# Patient Record
Sex: Male | Born: 1945
Health system: Southern US, Community
[De-identification: ages and names within clinical notes are randomized; demographics above are authoritative.]

## PROBLEM LIST (undated history)

## (undated) DIAGNOSIS — I10 Essential (primary) hypertension: Secondary | ICD-10-CM

## (undated) DIAGNOSIS — R413 Other amnesia: Secondary | ICD-10-CM

## (undated) DIAGNOSIS — Z800 Family history of malignant neoplasm of digestive organs: Secondary | ICD-10-CM

## (undated) HISTORY — DX: Other amnesia: R41.3

## (undated) HISTORY — DX: Family history of malignant neoplasm of digestive organs: Z80.0

---

## 2006-07-17 ENCOUNTER — Ambulatory Visit: Payer: Self-pay | Admitting: Internal Medicine

## 2006-07-17 ENCOUNTER — Ambulatory Visit (HOSPITAL_COMMUNITY): Admission: RE | Admit: 2006-07-17 | Discharge: 2006-07-17 | Payer: Self-pay | Admitting: Internal Medicine

## 2007-08-05 ENCOUNTER — Ambulatory Visit (HOSPITAL_COMMUNITY): Admission: RE | Admit: 2007-08-05 | Discharge: 2007-08-05 | Payer: Self-pay | Admitting: Family Medicine

## 2009-01-12 ENCOUNTER — Emergency Department (HOSPITAL_COMMUNITY): Admission: EM | Admit: 2009-01-12 | Discharge: 2009-01-12 | Payer: Self-pay | Admitting: Emergency Medicine

## 2010-09-09 NOTE — Op Note (Signed)
NAME:  Russell Reed, Russell Reed              ACCOUNT NO.:  [id number]   MEDICAL RECORD NO.:  [id number]          PATIENT TYPE:  AMB   LOCATION:  DAY                           FACILITY:  [facility]   PHYSICIAN:  Lionel December, M.D.    DATE OF BIRTH:  1945/10/28   DATE OF PROCEDURE:  07/17/2006  DATE OF DISCHARGE:                               OPERATIVE REPORT   PROCEDURE:  Colonoscopy.   INDICATIONS:  [name] is 65 year old Afro American male who is undergoing  average risk screening colonoscopy.  Procedure and risks were reviewed  with the patient, informed consent was obtained.   MEDICATIONS FOR CONSCIOUS SEDATION:  Demerol 50 mg IV, Versed 5 mg IV in  divided doses.   FINDINGS:  Procedure performed in endoscopy suite.  The patient's vital  signs and O2 sat were monitored during procedure and remained stable.  The patient was placed in the left lateral position and rectal  examination performed.  No abnormality noted on external or digital  exam.  Pentax videoscope was placed in the rectum and advanced under  vision into the sigmoid colon and beyond.  Somewhat redundant colon with  excellent prep.  Using abdominal pressure and the patient on his back to  advance the scope into cecum which was identified by appendiceal orifice  and ileocecal valve.  Pictures taken for the record.  As the scope was  withdrawn, colonic mucosa was carefully examined and was normal  throughout.  Rectal mucosa similarly was normal.  Scope was retroflexed  to examine anorectal junction and small hemorrhoids noted below the  dentate line.  Endoscope was straightened and withdrawn.  The patient  tolerated the procedure well.   FINAL DIAGNOSIS:  Small external hemorrhoids, otherwise normal  colonoscopy.   RECOMMENDATIONS:  1. He will resume his usual medicines and  diet.  2. Yearly Hemoccults.  3. Consider next screening exam in 10 years from now.      Lionel December, M.D.  Electronically Signed     NR/MEDQ  D:   07/17/2006  T:  07/17/2006  Job:  [id number]   cc:   Annia Friendly. Loleta Chance, MD  Fax: [phone]

## 2014-07-06 ENCOUNTER — Emergency Department (HOSPITAL_COMMUNITY): Payer: No Typology Code available for payment source

## 2014-07-06 ENCOUNTER — Emergency Department (HOSPITAL_COMMUNITY)
Admission: EM | Admit: 2014-07-06 | Discharge: 2014-07-06 | Disposition: A | Discharge status: Home / Self Care | Payer: No Typology Code available for payment source | Attending: Emergency Medicine | Admitting: Emergency Medicine

## 2014-07-06 ENCOUNTER — Encounter (HOSPITAL_COMMUNITY): Payer: Self-pay

## 2014-07-06 DIAGNOSIS — Y92410 Unspecified street and highway as the place of occurrence of the external cause: Secondary | ICD-10-CM | POA: Insufficient documentation

## 2014-07-06 DIAGNOSIS — I10 Essential (primary) hypertension: Secondary | ICD-10-CM | POA: Insufficient documentation

## 2014-07-06 DIAGNOSIS — Y998 Other external cause status: Secondary | ICD-10-CM | POA: Diagnosis not present

## 2014-07-06 DIAGNOSIS — S20219A Contusion of unspecified front wall of thorax, initial encounter: Secondary | ICD-10-CM | POA: Diagnosis not present

## 2014-07-06 DIAGNOSIS — Y9389 Activity, other specified: Secondary | ICD-10-CM | POA: Diagnosis not present

## 2014-07-06 DIAGNOSIS — S29001A Unspecified injury of muscle and tendon of front wall of thorax, initial encounter: Secondary | ICD-10-CM | POA: Diagnosis present

## 2014-07-06 HISTORY — DX: Essential (primary) hypertension: I10

## 2014-07-06 MED ORDER — IBUPROFEN 400 MG PO TABS
400.0000 mg | ORAL_TABLET | Freq: Once | ORAL | Status: AC
  Administered 2014-07-06: 400 mg via ORAL
  Filled 2014-07-06: qty 1
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
## 2014-07-06 NOTE — Discharge Instructions (Signed)
Use ibuprofen 400 mg 3 times a day as needed for pain. Use ice on the sore area, for 2 days after that, use heat.   Chest Contusion A chest contusion is a deep bruise on your chest area. Contusions are the result of an injury that caused bleeding under the skin. A chest contusion may involve bruising of the skin, muscles, or ribs. The contusion may turn blue, purple, or yellow. Minor injuries will give you a painless contusion, but more severe contusions may stay painful and swollen for a few weeks. CAUSES  A contusion is usually caused by a blow, trauma, or direct force to an area of the body. SYMPTOMS   Swelling and redness of the injured area.  Discoloration of the injured area.  Tenderness and soreness of the injured area.  Pain. DIAGNOSIS  The diagnosis can be made by taking a history and performing a physical exam. An X-ray, CT scan, or MRI may be needed to determine if there were any associated injuries, such as broken bones (fractures) or internal injuries. TREATMENT  Often, the best treatment for a chest contusion is resting, icing, and applying cold compresses to the injured area. Deep breathing exercises may be recommended to reduce the risk of pneumonia. Over-the-counter medicines may also be recommended for pain control. HOME CARE INSTRUCTIONS   Put ice on the injured area.  Put ice in a plastic bag.  Place a towel between your skin and the bag.  Leave the ice on for 15-20 minutes, 03-04 times a day.  Only take over-the-counter or prescription medicines as directed by your caregiver. Your caregiver may recommend avoiding anti-inflammatory medicines (aspirin, ibuprofen, and naproxen) for 48 hours because these medicines may increase bruising.  Rest the injured area.  Perform deep-breathing exercises as directed by your caregiver.  Stop smoking if you smoke.  Do not lift objects over 5 pounds (2.3 kg) for 3 days or longer if recommended by your caregiver. SEEK  IMMEDIATE MEDICAL CARE IF:   You have increased bruising or swelling.  You have pain that is getting worse.  You have difficulty breathing.  You have dizziness, weakness, or fainting.  You have blood in your urine or stool.  You cough up or vomit blood.  Your swelling or pain is not relieved with medicines. MAKE SURE YOU:   Understand these instructions.  Will watch your condition.  Will get help right away if you are not doing well or get worse. Document Released: 01/03/2001 Document Revised: 01/03/2012 Document Reviewed: 10/02/2011 [facility] Patient Information 2015 [place], [place]. This information is not intended to replace advice given to you by your health care provider. Make sure you discuss any questions you have with your health care provider.  Motor Vehicle Collision It is common to have multiple bruises and sore muscles after a motor vehicle collision (MVC). These tend to feel worse for the first 24 hours. You may have the most stiffness and soreness over the first several hours. You may also feel worse when you wake up the first morning after your collision. After this point, you will usually begin to improve with each day. The speed of improvement often depends on the severity of the collision, the number of injuries, and the location and nature of these injuries. HOME CARE INSTRUCTIONS  Put ice on the injured area.  Put ice in a plastic bag.  Place a towel between your skin and the bag.  Leave the ice on for 15-20 minutes, 3-4 times a day,  or as directed by your health care provider.  Drink enough fluids to keep your urine clear or pale yellow. Do not drink alcohol.  Take a warm shower or bath once or twice a day. This will increase blood flow to sore muscles.  You may return to activities as directed by your caregiver. Be careful when lifting, as this may aggravate neck or back pain.  Only take over-the-counter or prescription medicines for pain, discomfort,  or fever as directed by your caregiver. Do not use aspirin. This may increase bruising and bleeding. SEEK IMMEDIATE MEDICAL CARE IF:  You have numbness, tingling, or weakness in the arms or legs.  You develop severe headaches not relieved with medicine.  You have severe neck pain, especially tenderness in the middle of the back of your neck.  You have changes in bowel or bladder control.  There is increasing pain in any area of the body.  You have shortness of breath, light-headedness, dizziness, or fainting.  You have chest pain.  You feel sick to your stomach (nauseous), throw up (vomit), or sweat.  You have increasing abdominal discomfort.  There is blood in your urine, stool, or vomit.  You have pain in your shoulder (shoulder strap areas).  You feel your symptoms are getting worse. MAKE SURE YOU:  Understand these instructions.  Will watch your condition.  Will get help right away if you are not doing well or get worse. Document Released: 04/10/2005 Document Revised: 08/25/2013 Document Reviewed: 09/07/2010 [facility] Patient Information 2015 [place], [place]. This information is not intended to replace advice given to you by your health care provider. Make sure you discuss any questions you have with your health care provider.

## 2014-07-06 NOTE — ED Notes (Signed)
Patient ambulated to nurses station for EDP, gait steady.
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
## 2015-02-04 ENCOUNTER — Encounter (INDEPENDENT_AMBULATORY_CARE_PROVIDER_SITE_OTHER): Payer: Self-pay | Admitting: *Deleted

## 2015-03-10 ENCOUNTER — Ambulatory Visit (INDEPENDENT_AMBULATORY_CARE_PROVIDER_SITE_OTHER): Payer: Medicare Other | Admitting: Internal Medicine

## 2015-03-10 ENCOUNTER — Telehealth (INDEPENDENT_AMBULATORY_CARE_PROVIDER_SITE_OTHER): Payer: Self-pay | Admitting: *Deleted

## 2015-03-10 ENCOUNTER — Other Ambulatory Visit (INDEPENDENT_AMBULATORY_CARE_PROVIDER_SITE_OTHER): Payer: Self-pay | Admitting: Internal Medicine

## 2015-03-10 ENCOUNTER — Encounter (INDEPENDENT_AMBULATORY_CARE_PROVIDER_SITE_OTHER): Payer: Self-pay | Admitting: Internal Medicine

## 2015-03-10 VITALS — BP 92/70 | HR 64 | Temp 97.8°F | Ht 71.0 in | Wt 158.5 lb

## 2015-03-10 DIAGNOSIS — I10 Essential (primary) hypertension: Secondary | ICD-10-CM | POA: Insufficient documentation

## 2015-03-10 DIAGNOSIS — R634 Abnormal weight loss: Secondary | ICD-10-CM

## 2015-03-10 DIAGNOSIS — Z1211 Encounter for screening for malignant neoplasm of colon: Secondary | ICD-10-CM

## 2015-03-10 DIAGNOSIS — R110 Nausea: Secondary | ICD-10-CM | POA: Diagnosis not present

## 2015-03-10 DIAGNOSIS — Z800 Family history of malignant neoplasm of digestive organs: Secondary | ICD-10-CM | POA: Diagnosis not present

## 2015-03-10 NOTE — Progress Notes (Signed)
   Subjective:    Patient ID: Russell Reed, male    DOB: 06/28/1945, 69 y.o.   MRN: [id number]  HPI 69 yr old black male referred to our office for dysphagia, nausea, and weight loss. He had nausea which started several months ago. He would wake up with nausea. He had loss of appetite. There was no dysphagia. There was no fever during this time.  The nausea lasted about 6 months and now has resolved. No nausea in 2 months. He says his appetite is better. He has gained 10 pounds since September 2017. His lowest weight was 138.  He says he did not have a virus. He was not depressed. He says he is 100% better. There is no abdominal pain. He has a BM about x 1 a day. No melena or BRRB.        Per Dr. Jorge NyHills records weight loss of 26 pounds since 07/30/2007. Has lost 8.9 pounds since 06/29/2014 Family hx of colon cancer in a sister in her sisters, doing well.    01/19/2015 H and H  14.4 and 41.5, MCV 107.8, platelet 225, CEA 2.5 TSH 1.628, Free Thyroxine 2.9, T3 Uptake 33, T4 8.8. , bili 0.7, ALP 51, AST 13, ALT 8, total protein 7.4, Albumin 4.4.    07/17/2006 Colonoscopy.  INDICATIONS: Link Snufferddie is 69 year old Afro American male who is undergoing average risk screening colonoscopy. Procedure and risks were reviewed with the patient, informed consent was obtained.  FINAL DIAGNOSIS: Small external hemorrhoids, otherwise normal colonoscopy.   Review of Systems Past Medical History  Diagnosis Date  . Hypertension   . Hypertension   . Family hx of colon cancer     No past surgical history on file.  No Known Allergies  No current outpatient prescriptions on file prior to visit.   No current facility-administered medications on file prior to visit.        Objective:   Physical ExamBlood pressure 92/70, pulse 64, temperature 97.8 F (36.6 C), height 5\' 11"  (1.803 m), weight 158 lb 8 oz (71.895 kg). Alert and oriented. Skin warm and dry. Oral mucosa is moist.    . Sclera anicteric, conjunctivae is pink. Thyroid not enlarged. No cervical lymphadenopathy. Lungs clear. Heart regular rate and rhythm.  Abdomen is soft. Bowel sounds are positive. No hepatomegaly. No abdominal masses felt. No tenderness.  No edema to lower extremities.  Stool brown and guaiac negative.        Assessment & Plan:  Weight loss, nausea which has now resolved. He has gained 10 pounds since his OV with Dr. Loleta ChanceHIll in September. Patient needs EGD to rule out PUD.  Family hx of colon cancer in a sister in her 4960s. Colonoscopy. The risks and benefits such as perforation, bleeding, and infection were reviewed with the patient and is agreeable.

## 2015-03-10 NOTE — Telephone Encounter (Signed)
Patient needs trilyte 

## 2015-03-10 NOTE — Patient Instructions (Signed)
EGD/Colonoscopy. The risks and benefits such as perforation, bleeding, and infection were reviewed with the patient and is agreeable. 

## 2015-03-11 ENCOUNTER — Encounter (INDEPENDENT_AMBULATORY_CARE_PROVIDER_SITE_OTHER): Payer: Self-pay

## 2015-03-12 MED ORDER — PEG 3350-KCL-NA BICARB-NACL 420 G PO SOLR: 4000.0000 mL | Freq: Once | ORAL | Status: DC

## 2015-03-29 ENCOUNTER — Ambulatory Visit (HOSPITAL_COMMUNITY)
Admission: RE | Admit: 2015-03-29 | Discharge: 2015-03-29 | Disposition: A | Discharge status: Home / Self Care | Payer: Medicare Other | Source: Ambulatory Visit | Attending: Internal Medicine | Admitting: Internal Medicine

## 2015-03-29 ENCOUNTER — Encounter (HOSPITAL_COMMUNITY)
Admission: RE | Disposition: A | Discharge status: Home / Self Care | Payer: Self-pay | Source: Ambulatory Visit | Attending: Internal Medicine

## 2015-03-29 ENCOUNTER — Encounter (HOSPITAL_COMMUNITY): Payer: Self-pay | Admitting: *Deleted

## 2015-03-29 DIAGNOSIS — R634 Abnormal weight loss: Secondary | ICD-10-CM | POA: Insufficient documentation

## 2015-03-29 DIAGNOSIS — R110 Nausea: Secondary | ICD-10-CM | POA: Insufficient documentation

## 2015-03-29 DIAGNOSIS — I10 Essential (primary) hypertension: Secondary | ICD-10-CM | POA: Insufficient documentation

## 2015-03-29 DIAGNOSIS — Z7982 Long term (current) use of aspirin: Secondary | ICD-10-CM | POA: Insufficient documentation

## 2015-03-29 DIAGNOSIS — Z1211 Encounter for screening for malignant neoplasm of colon: Secondary | ICD-10-CM | POA: Insufficient documentation

## 2015-03-29 DIAGNOSIS — K210 Gastro-esophageal reflux disease with esophagitis: Secondary | ICD-10-CM | POA: Insufficient documentation

## 2015-03-29 DIAGNOSIS — K449 Diaphragmatic hernia without obstruction or gangrene: Secondary | ICD-10-CM

## 2015-03-29 DIAGNOSIS — Z800 Family history of malignant neoplasm of digestive organs: Secondary | ICD-10-CM | POA: Diagnosis not present

## 2015-03-29 DIAGNOSIS — K2210 Ulcer of esophagus without bleeding: Secondary | ICD-10-CM | POA: Diagnosis not present

## 2015-03-29 DIAGNOSIS — K644 Residual hemorrhoidal skin tags: Secondary | ICD-10-CM | POA: Insufficient documentation

## 2015-03-29 DIAGNOSIS — K648 Other hemorrhoids: Secondary | ICD-10-CM | POA: Diagnosis not present

## 2015-03-29 DIAGNOSIS — Z79899 Other long term (current) drug therapy: Secondary | ICD-10-CM | POA: Insufficient documentation

## 2015-03-29 HISTORY — PX: ESOPHAGOGASTRODUODENOSCOPY: SHX5428

## 2015-03-29 HISTORY — PX: COLONOSCOPY: SHX5424

## 2015-03-29 SURGERY — COLONOSCOPY
Anesthesia: Moderate Sedation

## 2015-03-29 MED ORDER — STERILE WATER FOR IRRIGATION IR SOLN
Status: DC | PRN
  Administered 2015-03-29: 08:00:00

## 2015-03-29 MED ORDER — BUTAMBEN-TETRACAINE-BENZOCAINE 2-2-14 % EX AERO
INHALATION_SPRAY | CUTANEOUS | Status: DC | PRN
  Administered 2015-03-29: 2 via TOPICAL

## 2015-03-29 MED ORDER — SODIUM CHLORIDE 0.9 % IV SOLN
INTRAVENOUS | Status: DC
  Administered 2015-03-29: 07:00:00 via INTRAVENOUS

## 2015-03-29 MED ORDER — MIDAZOLAM HCL 5 MG/5ML IJ SOLN
INTRAMUSCULAR | Status: DC | PRN
  Administered 2015-03-29 (×2): 2 mg via INTRAVENOUS
  Administered 2015-03-29: 1 mg via INTRAVENOUS
  Administered 2015-03-29: 2 mg via INTRAVENOUS

## 2015-03-29 MED ORDER — MEPERIDINE HCL 50 MG/ML IJ SOLN
INTRAMUSCULAR | Status: DC | PRN
  Administered 2015-03-29 (×2): 25 mg via INTRAVENOUS

## 2015-03-29 MED ORDER — MEPERIDINE HCL 50 MG/ML IJ SOLN
INTRAMUSCULAR | Status: AC
  Filled 2015-03-29: qty 1

## 2015-03-29 MED ORDER — MIDAZOLAM HCL 5 MG/5ML IJ SOLN
INTRAMUSCULAR | Status: AC
  Filled 2015-03-29: qty 10

## 2015-03-29 MED ORDER — BUTAMBEN-TETRACAINE-BENZOCAINE 2-2-14 % EX AERO
INHALATION_SPRAY | CUTANEOUS | Status: AC
  Filled 2015-03-29: qty 20

## 2015-03-29 MED ORDER — PANTOPRAZOLE SODIUM 40 MG PO TBEC: 40.0000 mg | DELAYED_RELEASE_TABLET | Freq: Every day | ORAL | Status: DC

## 2015-03-29 NOTE — Op Note (Signed)
EGD PROCEDURE REPORT  PATIENT:  Russell Reed  MR#:  [id number] Birthdate:  08/01/1945, 69 y.o., male Endoscopist:  Dr. Malissa HippoNajeeb U. Jahdai Padovano, MD Referred By:  Dr. Annia FriendlyGerald K. Loleta ChanceHill, MD Procedure Date: 03/29/2015  Procedure:   EGD & Colonoscopy  Indications:  Patient is 69 year old African-American male who presents with history of weight loss and nausea. His nausea has improved and he has gained a few pounds back. There is no history of vomiting abdominal pain melena or rectal bleeding. Family states positive for CRC and sister. He is undergoing diagnostic EGD and high risk screening colonoscopy.            Informed Consent:  The risks, benefits, alternatives & imponderables which include, but are not limited to, bleeding, infection, perforation, drug reaction and potential missed lesion have been reviewed.  The potential for biopsy, lesion removal, esophageal dilation, etc. have also been discussed.  Questions have been answered.  All parties agreeable.  Please see history & physical in medical record for more information.  Medications:  Demerol 50 mg IV Versed 7 mg IV Cetacaine spray topically for oropharyngeal anesthesia  EGD  Description of procedure:  The endoscope was introduced through the mouth and advanced to the second portion of the duodenum without difficulty or limitations. The mucosal surfaces were surveyed very carefully during advancement of the scope and upon withdrawal.  Findings:  Esophagus:  Mucosa of the esophagus was normal. Friable GE junction with two small erosions. No ring or stricture noted. GEJ:  39 cm Hiatus:  41 cm Stomach:  Stomach was empty and distended very well with insufflation. Folds in the proximal stomach were normal. Examination of mucosa at gastric body, antrum, pyloric channel, angularis fundus and cardia was normal. Duodenum:  Normal bulbar and post bulbar mucosa.  Therapeutic/Diagnostic Maneuvers Performed:  None  COLONOSCOPY Description of  procedure:  After a digital rectal exam was performed, that colonoscope was advanced from the anus through the rectum and colon to the area of the cecum, ileocecal valve and appendiceal orifice. The cecum was deeply intubated. These structures were well-seen and photographed for the record. From the level of the cecum and ileocecal valve, the scope was slowly and cautiously withdrawn. The mucosal surfaces were carefully surveyed utilizing scope tip to flexion to facilitate fold flattening as needed. The scope was pulled down into the rectum where a thorough exam including retroflexion was performed.  Findings:   Prep excellent. Normal mucosa of cecum, ascending colon, hepatic flexure, transverse colon, splenic flexure, descending and sigmoid colon. Normal rectal mucosa. Small hemorrhoids above and below the dentate line.  Therapeutic/Diagnostic Maneuvers Performed:  None  Complications:  None  EBL: Minimal from GE junction.  Cecal Withdrawal Time:  9 minutes  Impression:  EGD findings: Erosive reflux esophagitis and small sliding hiatal hernia. No evidence of peptic ulcer disease.  Colonoscopy findings: Normal colonoscopy except internal and external hemorrhoids.   Recommendations:  Standard instructions given. Anti-reflux measures. Pantoprazole 40 mg by mouth 30 minutes before breakfast daily. Office visit in 3 months. Next colonoscopy in 5 years.    Saje Gallop U  03/29/2015 8:27 AM  CC: Dr. Evlyn CourierHILL,GERALD K, MD & Dr. Bonnetta BarryNo ref. provider found

## 2015-03-29 NOTE — Discharge Instructions (Signed)
Resume usual medications and diet. Pantoprazole 40 mg by mouth 30 minutes before breakfast daily. Anti-reflux measures. No driving for 24 hours. Office visit in 3 months.  Gastrointestinal Endoscopy, Care After Refer to this sheet in the next few weeks. These instructions provide you with information on caring for yourself after your procedure. Your caregiver may also give you more specific instructions. Your treatment has been planned according to current medical practices, but problems sometimes occur. Call your caregiver if you have any problems or questions after your procedure. HOME CARE INSTRUCTIONS  If you were given medicine to help you relax (sedative), do not drive, operate machinery, or sign important documents for 24 hours.  Avoid alcohol and hot or warm beverages for the first 24 hours after the procedure.  Only take over-the-counter or prescription medicines for pain, discomfort, or fever as directed by your caregiver. You may resume taking your normal medicines unless your caregiver tells you otherwise. Ask your caregiver when you may resume taking medicines that may cause bleeding, such as aspirin, clopidogrel, or warfarin.  You may return to your normal diet and activities on the day after your procedure, or as directed by your caregiver. Walking may help to reduce any bloated feeling in your abdomen.  Drink enough fluids to keep your urine clear or pale yellow.  You may gargle with salt water if you have a sore throat. SEEK IMMEDIATE MEDICAL CARE IF:  You have severe nausea or vomiting.  You have severe abdominal pain, abdominal cramps that last longer than 6 hours, or abdominal swelling (distention).  You have severe shoulder or back pain.  You have trouble swallowing.  You have shortness of breath, your breathing is shallow, or you are breathing faster than normal.  You have a fever or a rapid heartbeat.  You vomit blood or material that looks like coffee  grounds.  You have bloody, black, or tarry stools. MAKE SURE YOU:  Understand these instructions.  Will watch your condition.  Will get help right away if you are not doing well or get worse.   This information is not intended to replace advice given to you by your health care provider. Make sure you discuss any questions you have with your health care provider.   Document Released: 11/23/2003 Document Revised: 05/01/2014 Document Reviewed: 07/11/2011 Elsevier Interactive Patient Education 2016 Elsevier Inc.    Gastroesophageal Reflux Disease, Adult Normally, food travels down the esophagus and stays in the stomach to be digested. However, when a person has gastroesophageal reflux disease (GERD), food and stomach acid move back up into the esophagus. When this happens, the esophagus becomes sore and inflamed. Over time, GERD can create small holes (ulcers) in the lining of the esophagus.  CAUSES This condition is caused by a problem with the muscle between the esophagus and the stomach (lower esophageal sphincter, or LES). Normally, the LES muscle closes after food passes through the esophagus to the stomach. When the LES is weakened or abnormal, it does not close properly, and that allows food and stomach acid to go back up into the esophagus. The LES can be weakened by certain dietary substances, medicines, and medical conditions, including:  Tobacco use.  Pregnancy.  Having a hiatal hernia.  Heavy alcohol use.  Certain foods and beverages, such as coffee, chocolate, onions, and peppermint. RISK FACTORS This condition is more likely to develop in:  People who have an increased body weight.  People who have connective tissue disorders.  People who use NSAID  medicines. SYMPTOMS Symptoms of this condition include:  Heartburn.  Difficult or painful swallowing.  The feeling of having a lump in the throat.  Abitter taste in the mouth.  Bad breath.  Having a large  amount of saliva.  Having an upset or bloated stomach.  Belching.  Chest pain.  Shortness of breath or wheezing.  Ongoing (chronic) cough or a night-time cough.  Wearing away of tooth enamel.  Weight loss. Different conditions can cause chest pain. Make sure to see your health care provider if you experience chest pain. DIAGNOSIS Your health care provider will take a medical history and perform a physical exam. To determine if you have mild or severe GERD, your health care provider may also monitor how you respond to treatment. You may also have other tests, including:  An endoscopy toexamine your stomach and esophagus with a small camera.  A test thatmeasures the acidity level in your esophagus.  A test thatmeasures how much pressure is on your esophagus.  A barium swallow or modified barium swallow to show the shape, size, and functioning of your esophagus. TREATMENT The goal of treatment is to help relieve your symptoms and to prevent complications. Treatment for this condition may vary depending on how severe your symptoms are. Your health care provider may recommend:  Changes to your diet.  Medicine.  Surgery. HOME CARE INSTRUCTIONS Diet  Follow a diet as recommended by your health care provider. This may involve avoiding foods and drinks such as:  Coffee and tea (with or without caffeine).  Drinks that containalcohol.  Energy drinks and sports drinks.  Carbonated drinks or sodas.  Chocolate and cocoa.  Peppermint and mint flavorings.  Garlic and onions.  Horseradish.  Spicy and acidic foods, including peppers, chili powder, curry powder, vinegar, hot sauces, and barbecue sauce.  Citrus fruit juices and citrus fruits, such as oranges, lemons, and limes.  Tomato-based foods, such as red sauce, chili, salsa, and pizza with red sauce.  Fried and fatty foods, such as donuts, french fries, potato chips, and high-fat dressings.  High-fat meats, such  as hot dogs and fatty cuts of red and white meats, such as rib eye steak, sausage, ham, and bacon.  High-fat dairy items, such as whole milk, butter, and cream cheese.  Eat small, frequent meals instead of large meals.  Avoid drinking large amounts of liquid with your meals.  Avoid eating meals during the 2-3 hours before bedtime.  Avoid lying down right after you eat.  Do not exercise right after you eat. General Instructions  Pay attention to any changes in your symptoms.  Take over-the-counter and prescription medicines only as told by your health care provider. Do not take aspirin, ibuprofen, or other NSAIDs unless your health care provider told you to do so.  Do not use any tobacco products, including cigarettes, chewing tobacco, and e-cigarettes. If you need help quitting, ask your health care provider.  Wear loose-fitting clothing. Do not wear anything tight around your waist that causes pressure on your abdomen.  Raise (elevate) the head of your bed 6 inches (15cm).  Try to reduce your stress, such as with yoga or meditation. If you need help reducing stress, ask your health care provider.  If you are overweight, reduce your weight to an amount that is healthy for you. Ask your health care provider for guidance about a safe weight loss goal.  Keep all follow-up visits as told by your health care provider. This is important. [place]  IF:  You have new symptoms.  You have unexplained weight loss.  You have difficulty swallowing, or it hurts to swallow.  You have wheezing or a persistent cough.  Your symptoms do not improve with treatment.  You have a hoarse voice. SEEK IMMEDIATE MEDICAL CARE IF:  You have pain in your arms, neck, jaw, teeth, or back.  You feel sweaty, dizzy, or light-headed.  You have chest pain or shortness of breath.  You vomit and your vomit looks like blood or coffee grounds.  You faint.  Your stool is bloody or  black.  You cannot swallow, drink, or eat.   This information is not intended to replace advice given to you by your health care provider. Make sure you discuss any questions you have with your health care provider.   Document Released: 01/18/2005 Document Revised: 12/30/2014 Document Reviewed: 08/05/2014 Elsevier Interactive Patient Education [employer]2016 Elsevier Inc.

## 2015-03-29 NOTE — H&P (Signed)
Russell Reed is an 69 y.o. male.   Chief Complaint: Patient is here for EGD and colonoscopy. HPI: Patient is 69 year old African-American male who presents with over 6 months history of nausea associated with weight loss. States he lost close. Pounds in the last 2 months he has gained 10 pounds back. Has not had vomiting heartburn dysphagia abdominal pain or melena. He is also undergoing screening colonoscopy. Last exam was about 9 years ago. Family significant for CRC and sister who was in her 4370s at the time of diagnosis. She is now 68 years old.  Past Medical History  Diagnosis Date  . Hypertension   . Hypertension   . Family hx of colon cancer     History reviewed. No pertinent past surgical history.  Family History  Problem Relation Age of Onset  . Colon cancer Sister    Social History:  reports that he has never smoked. He does not have any smokeless tobacco history on file. He reports that he does not drink alcohol or use illicit drugs.  Allergies: No Known Allergies  Medications Prior to Admission  Medication Sig Dispense Refill  . aspirin 81 MG tablet Take 81 mg by mouth daily.    Marland Kitchen. lisinopril-hydrochlorothiazide (PRINZIDE,ZESTORETIC) 10-12.5 MG tablet Take 1 tablet by mouth daily.    . Multiple Vitamin (MULTIVITAMIN) tablet Take 1 tablet by mouth daily.    . polyethylene glycol-electrolytes (NULYTELY/GOLYTELY) 420 G solution Take 4,000 mLs by mouth once. 4000 mL 0    No results found for this or any previous visit (from the past 48 hour(s)). No results found.  ROS  Blood pressure 132/92, pulse 73, temperature 98.2 F (36.8 C), temperature source Oral, resp. rate 19, height 5\' 11"  (1.803 m), weight 164 lb (74.39 kg), SpO2 98 %. Physical Exam  Constitutional: He appears well-developed and well-nourished.  HENT:  Mouth/Throat: Oropharynx is clear and moist.  Eyes: Conjunctivae are normal. No scleral icterus.  Neck: No thyromegaly present.  Cardiovascular: Normal  rate, regular rhythm and normal heart sounds.   No murmur heard. Respiratory: Effort normal and breath sounds normal.  GI: Soft. He exhibits no distension and no mass. There is no tenderness.  Musculoskeletal: He exhibits no edema.  Lymphadenopathy:    He has no cervical adenopathy.  Neurological: He is alert.  Skin: Skin is warm and dry.     Assessment/Plan Nausea and weight loss. Family history of CRC in first-degree relative. Diagnostic EGD and screening colonoscopy.  Russell Reed U 03/29/2015, 7:34 AM

## 2015-03-30 ENCOUNTER — Encounter (HOSPITAL_COMMUNITY): Payer: Self-pay | Admitting: Internal Medicine

## 2015-06-28 ENCOUNTER — Encounter (INDEPENDENT_AMBULATORY_CARE_PROVIDER_SITE_OTHER): Payer: Self-pay | Admitting: Internal Medicine

## 2015-06-28 ENCOUNTER — Ambulatory Visit (INDEPENDENT_AMBULATORY_CARE_PROVIDER_SITE_OTHER): Payer: Medicare Other | Admitting: Internal Medicine

## 2015-06-28 VITALS — BP 130/90 | HR 68 | Temp 98.5°F | Ht 71.0 in | Wt 154.1 lb

## 2015-06-28 DIAGNOSIS — K2210 Ulcer of esophagus without bleeding: Secondary | ICD-10-CM

## 2015-06-28 DIAGNOSIS — K208 Other esophagitis: Secondary | ICD-10-CM

## 2015-06-28 MED ORDER — PANTOPRAZOLE SODIUM 40 MG PO TBEC: 40.0000 mg | DELAYED_RELEASE_TABLET | Freq: Two times a day (BID) | ORAL | Status: DC

## 2015-06-28 NOTE — Patient Instructions (Addendum)
Protonix increased to twice a day.  OV in 3 months.  Call with a weight check in 2 weeks.

## 2015-06-28 NOTE — Progress Notes (Addendum)
   Subjective:    Patient ID: Russell Reed, male    DOB: 07/27/1945, 70 y.o.   MRN: [id number] PCP Mirna MiresGerald Hill  HPI Here today for f/u after undergoing EGD/Colonosocopy in December. EGD revealed erosive reflux esophagitis. Presently taking Protonix 40mg  daily before breakfast. He tells me he is doing good. The Protonix helps with his acid reflux.  He has nausea sometimes in the morning.  His appetite is okay. He has lost 4 pounds since his visit in December. He denies any abdominal or chest pain.  BMs are normal. Usually has a BM daily. No melena or BRRB.  Colonoscopy normal except for internal and external hemorrhoids         03/29/2015 EGD & Colonoscopy  Indications: Patient is 70 year old African-American male who presents with history of weight loss and nausea. His nausea has improved and he has gained a few pounds back. There is no history of vomiting abdominal pain melena or rectal bleeding. Family states positive for CRC and sister. He is undergoing diagnostic EGD and high risk screening colonoscopy.  Impression:  EGD findings: Erosive reflux esophagitis and small sliding hiatal hernia. No evidence of peptic ulcer disease.  Colonoscopy findings: Normal colonoscopy except internal and external hemorrhoids.   Review of Systems Past Medical History  Diagnosis Date  . Hypertension   . Hypertension   . Family hx of colon cancer     Past Surgical History  Procedure Laterality Date  . Colonoscopy N/A 03/29/2015    Procedure: COLONOSCOPY;  Surgeon: Malissa HippoNajeeb U Rehman, MD;  [facility];  Service: Endoscopy;  Laterality: N/A;  7:30  . Esophagogastroduodenoscopy N/A 03/29/2015    Procedure: ESOPHAGOGASTRODUODENOSCOPY (EGD);  Surgeon: Malissa HippoNajeeb U Rehman, MD;  Location: [facility];  Service: Endoscopy;  Laterality: N/A;    No Known  Allergies  Current Outpatient Prescriptions on File Prior to Visit  Medication Sig Dispense Refill  . aspirin 81 MG tablet Take 81 mg by mouth daily.    Marland Kitchen. lisinopril-hydrochlorothiazide (PRINZIDE,ZESTORETIC) 10-12.5 MG tablet Take 1 tablet by mouth daily.    . pantoprazole (PROTONIX) 40 MG tablet Take 1 tablet (40 mg total) by mouth daily before breakfast. 30 tablet 5   No current facility-administered medications on file prior to visit.        Objective:   Physical Exam  Blood pressure 130/90, pulse 68, temperature 98.5 F (36.9 C), height 5\' 11"  (1.803 m), weight 154 lb 1.6 oz (69.899 kg). Alert and oriented. Skin warm and dry. Oral mucosa is moist.   . Sclera anicteric, conjunctivae is pink. Thyroid not enlarged. No cervical lymphadenopathy. Lungs clear. Heart regular rate and rhythm.  Abdomen is soft. Bowel sounds are positive. No hepatomegaly. No abdominal masses felt. No tenderness.  No edema to lower extremities.         Assessment & Plan:  Erosive esophagitis. He will continue the Protonix 40mg  but I will increase to twice a day for a couple of months and see how he does.

## 2015-09-28 ENCOUNTER — Encounter (INDEPENDENT_AMBULATORY_CARE_PROVIDER_SITE_OTHER): Payer: Self-pay | Admitting: Internal Medicine

## 2015-09-28 ENCOUNTER — Ambulatory Visit (INDEPENDENT_AMBULATORY_CARE_PROVIDER_SITE_OTHER): Payer: Medicare Other | Admitting: Internal Medicine

## 2015-09-28 VITALS — BP 108/82 | HR 76 | Temp 97.9°F | Ht 71.0 in | Wt 155.0 lb

## 2015-09-28 DIAGNOSIS — K208 Other esophagitis: Secondary | ICD-10-CM | POA: Diagnosis not present

## 2015-09-28 DIAGNOSIS — K2100 Gastro-esophageal reflux disease with esophagitis, without bleeding: Secondary | ICD-10-CM

## 2015-09-28 DIAGNOSIS — K2210 Ulcer of esophagus without bleeding: Secondary | ICD-10-CM

## 2015-09-28 DIAGNOSIS — K219 Gastro-esophageal reflux disease without esophagitis: Secondary | ICD-10-CM | POA: Insufficient documentation

## 2015-09-28 MED ORDER — PANTOPRAZOLE SODIUM 40 MG PO TBEC: 40.0000 mg | DELAYED_RELEASE_TABLET | Freq: Two times a day (BID) | ORAL | Status: DC

## 2015-09-28 NOTE — Progress Notes (Signed)
   Subjective:    Patient ID: Russell Reed, male    DOB: 09/26/1945, 70 y.o.   MRN: [id number]  HPI Here today for f/u. Underwent an EGD in Deember which revealed erosive esophagitis. His Protonix was increased to BID that vist. GERD controlled with Protonix BID. His appetite is good. No weight loss. Usually has a BM daily. No melena or BRRB. Colonoscopy in December was normal except for internal and external hemorrhoids.       03/29/2015 EGD & Colonoscopy  Indications: Patient is 70 year old African-American male who presents with history of weight loss and nausea. His nausea has improved and he has gained a few pounds back. There is no history of vomiting abdominal pain melena or rectal bleeding. Family states positive for CRC and sister. He is undergoing diagnostic EGD and high risk screening colonoscopy.  Impression:  EGD findings: Erosive reflux esophagitis and small sliding hiatal hernia. No evidence of peptic ulcer disease.  Colonoscopy findings: Normal colonoscopy except internal and external hemorrhoids.  Review of Systems Past Medical History  Diagnosis Date  . Hypertension   . Hypertension   . Family hx of colon cancer     Past Surgical History  Procedure Laterality Date  . Colonoscopy N/A 03/29/2015    Procedure: COLONOSCOPY;  Surgeon: Malissa HippoNajeeb U Rehman, MD;  [facility]: [facility];  Service: Endoscopy;  Laterality: N/A;  7:30  . Esophagogastroduodenoscopy N/A 03/29/2015    Procedure: ESOPHAGOGASTRODUODENOSCOPY (EGD);  Surgeon: Malissa HippoNajeeb U Rehman, MD;  [facility]: [facility];  Service: Endoscopy;  Laterality: N/A;    No Known Allergies  Current Outpatient Prescriptions on File Prior to Visit  Medication Sig Dispense Refill  . aspirin 81 MG tablet Take 81 mg by mouth daily.    Marland Kitchen. lisinopril-hydrochlorothiazide (PRINZIDE,ZESTORETIC) 10-12.5  MG tablet Take 1 tablet by mouth daily.    . pantoprazole (PROTONIX) 40 MG tablet Take 1 tablet (40 mg total) by mouth 2 (two) times daily before a meal. 60 tablet 3   No current facility-administered medications on file prior to visit.        Objective:   Physical Exam Blood pressure 108/82, pulse 76, temperature 97.9 F (36.6 C), height 5\' 11"  (1.803 m), weight 155 lb (70.308 kg). Alert and oriented. Skin warm and dry. Oral mucosa is moist.   . Sclera anicteric, conjunctivae is pink. Thyroid not enlarged. No cervical lymphadenopathy. Lungs clear. Heart regular rate and rhythm.  Abdomen is soft. Bowel sounds are positive. No hepatomegaly. No abdominal masses felt. No tenderness.  No edema to lower extremities.          Assessment & Plan:  GERD controlled with Protonix BID Will continue the Protonix BID OV in 1 year.

## 2015-09-28 NOTE — Patient Instructions (Signed)
Continue the Protonix. OV in one year.  

## 2016-05-24 ENCOUNTER — Encounter: Payer: Self-pay | Admitting: Internal Medicine

## 2016-07-03 ENCOUNTER — Other Ambulatory Visit (INDEPENDENT_AMBULATORY_CARE_PROVIDER_SITE_OTHER): Payer: Self-pay | Admitting: Internal Medicine

## 2016-07-03 DIAGNOSIS — K2100 Gastro-esophageal reflux disease with esophagitis, without bleeding: Secondary | ICD-10-CM

## 2016-07-03 DIAGNOSIS — K2210 Ulcer of esophagus without bleeding: Secondary | ICD-10-CM

## 2016-09-22 ENCOUNTER — Encounter (INDEPENDENT_AMBULATORY_CARE_PROVIDER_SITE_OTHER): Payer: Self-pay | Admitting: Internal Medicine

## 2016-10-03 ENCOUNTER — Encounter (INDEPENDENT_AMBULATORY_CARE_PROVIDER_SITE_OTHER): Payer: Self-pay

## 2016-10-03 ENCOUNTER — Ambulatory Visit (INDEPENDENT_AMBULATORY_CARE_PROVIDER_SITE_OTHER): Payer: Medicare Other | Admitting: Internal Medicine

## 2016-10-03 ENCOUNTER — Encounter (INDEPENDENT_AMBULATORY_CARE_PROVIDER_SITE_OTHER): Payer: Self-pay | Admitting: Internal Medicine

## 2016-10-03 VITALS — BP 120/84 | HR 60 | Temp 97.4°F | Ht 71.0 in | Wt 154.0 lb

## 2016-10-03 DIAGNOSIS — K219 Gastro-esophageal reflux disease without esophagitis: Secondary | ICD-10-CM | POA: Diagnosis not present

## 2016-10-03 DIAGNOSIS — K2210 Ulcer of esophagus without bleeding: Secondary | ICD-10-CM | POA: Diagnosis not present

## 2016-10-03 MED ORDER — OMEPRAZOLE 40 MG PO CPDR: 40.0000 mg | DELAYED_RELEASE_CAPSULE | Freq: Every day | ORAL | 3 refills | Status: DC

## 2016-10-03 NOTE — Patient Instructions (Signed)
OV in 1 year. Continue the PPI

## 2016-10-03 NOTE — Progress Notes (Signed)
Subjective:    Patient ID: Russell Reed, male    DOB: 10/31/1945, 71 y.o.   MRN: [id number]  HPI Here today for f/u. Last seen in June of 2017. Hx of GERD. Underwent in EGD/Colonoscopy in 2016 which revealed Impression:  EGD findings: Erosive reflux esophagitis and small sliding hiatal hernia. No evidence of peptic ulcer disease.  Colonoscopy findings: Normal colonoscopy except internal and external hemorrhoids.  He tells me he thinks the Protonix is making him smell under his arm.  Has been off x 2 weeks.  Last weight in June of 2017 155. Appetite is good. No weight. Has a BM daily x 2. No melena or BRRB.  He has no GI complaints except for the odor the Protonix causes him.   Review of Systems Past Medical History:  Diagnosis Date  . Family hx of colon cancer   . Hypertension   . Hypertension     Past Surgical History:  Procedure Laterality Date  . COLONOSCOPY N/A 03/29/2015   Procedure: COLONOSCOPY;  Surgeon: Malissa HippoNajeeb U Rehman, MD;  Location: [facility];  Service: Endoscopy;  Laterality: N/A;  7:30  . ESOPHAGOGASTRODUODENOSCOPY N/A 03/29/2015   Procedure: ESOPHAGOGASTRODUODENOSCOPY (EGD);  Surgeon: Malissa HippoNajeeb U Rehman, MD;  [facility]: [facility];  Service: Endoscopy;  Laterality: N/A;    No Known Allergies  Current Outpatient Prescriptions on File Prior to Visit  Medication Sig Dispense Refill  . aspirin 81 MG tablet Take 81 mg by mouth daily.    Marland Kitchen. lisinopril-hydrochlorothiazide (PRINZIDE,ZESTORETIC) 10-12.5 MG tablet Take 1 tablet by mouth daily.    . pantoprazole (PROTONIX) 40 MG tablet TAKE ONE TABLET BY MOUTH TWICE DAILY BEFORE A MEAL (Patient taking differently: once a day) 120 tablet 2   No current facility-administered medications on file prior to visit.         Objective:   Physical Exam Blood pressure 120/84, pulse 60, temperature 97.4 F (36.3 C), height 5\' 11"  (1.803 m), weight 154 lb (69.9 kg). Alert and oriented. Skin warm and dry. Oral mucosa is  moist.   . Sclera anicteric, conjunctivae is pink. Thyroid not enlarged. No cervical lymphadenopathy. Lungs clear. Heart regular rate and rhythm.  Abdomen is soft. Bowel sounds are positive. No hepatomegaly. No abdominal masses felt. No tenderness.  No edema to lower extremities.          Assessment & Plan:  Erosive esophagitis. Has been off Protonix x 2 weeks. Am going to start him on Omeprazole 40mg  daily.  OV in 1 year.  Past Medical History:  Diagnosis Date  . Family hx of colon cancer   . Hypertension   . Hypertension     Past Surgical History:  Procedure Laterality Date  . COLONOSCOPY N/A 03/29/2015   Procedure: COLONOSCOPY;  Surgeon: Malissa HippoNajeeb U Rehman, MD;  [facility]: [facility];  Service: Endoscopy;  Laterality: N/A;  7:30  . ESOPHAGOGASTRODUODENOSCOPY N/A 03/29/2015   Procedure: ESOPHAGOGASTRODUODENOSCOPY (EGD);  Surgeon: Malissa HippoNajeeb U Rehman, MD;  [facility]: [facility];  Service: Endoscopy;  Laterality: N/A;    No Known Allergies  Current Outpatient Prescriptions on File Prior to Visit  Medication Sig Dispense Refill  . aspirin 81 MG tablet Take 81 mg by mouth daily.    Marland Kitchen. lisinopril-hydrochlorothiazide (PRINZIDE,ZESTORETIC) 10-12.5 MG tablet Take 1 tablet by mouth daily.    . pantoprazole (PROTONIX) 40 MG tablet TAKE ONE TABLET BY MOUTH TWICE DAILY BEFORE A MEAL (Patient taking differently: once a day) 120 tablet 2   No current  facility-administered medications on file prior to visit.

## 2016-12-21 ENCOUNTER — Other Ambulatory Visit (HOSPITAL_COMMUNITY): Payer: Self-pay | Admitting: Family Medicine

## 2016-12-21 DIAGNOSIS — R413 Other amnesia: Secondary | ICD-10-CM

## 2017-01-01 ENCOUNTER — Ambulatory Visit (HOSPITAL_COMMUNITY)
Admission: RE | Admit: 2017-01-01 | Discharge: 2017-01-01 | Disposition: A | Discharge status: Home / Self Care | Payer: Medicare Other | Source: Ambulatory Visit | Attending: Family Medicine | Admitting: Family Medicine

## 2017-01-01 DIAGNOSIS — R413 Other amnesia: Secondary | ICD-10-CM

## 2017-03-13 ENCOUNTER — Encounter (INDEPENDENT_AMBULATORY_CARE_PROVIDER_SITE_OTHER): Payer: Self-pay | Admitting: Internal Medicine

## 2017-03-20 ENCOUNTER — Encounter (INDEPENDENT_AMBULATORY_CARE_PROVIDER_SITE_OTHER): Payer: Self-pay | Admitting: Internal Medicine

## 2017-03-20 ENCOUNTER — Encounter (INDEPENDENT_AMBULATORY_CARE_PROVIDER_SITE_OTHER): Payer: Self-pay

## 2017-03-20 ENCOUNTER — Ambulatory Visit (INDEPENDENT_AMBULATORY_CARE_PROVIDER_SITE_OTHER): Payer: Medicare Other | Admitting: Internal Medicine

## 2017-03-20 VITALS — BP 118/80 | HR 64 | Temp 98.0°F | Ht 71.0 in | Wt 151.7 lb

## 2017-03-20 DIAGNOSIS — K2210 Ulcer of esophagus without bleeding: Secondary | ICD-10-CM

## 2017-03-20 NOTE — Progress Notes (Signed)
Subjective:    Patient ID: Russell Reed, male    DOB: 02/03/1946, 71 y.o.   MRN: [id number] PCP Dr. Mirna MiresGerald Hill HPI Presents today with c/o GERD. Wife states his nausea has subsided.  Wife is concerned with wt loss. Weiight in June was 154. Today his weight is 151.7.  Wife is concerned he may have H. Pylori. His acid reflux is much better since starting the Omeprazole. His BMs are normal. Has a BM daily. He is eating better. His appetite has improved. He is not drinking Boost anymore.  Last seen in June of 2018. Hx of erosive esophagitis.  He has hearing aids now.  He has no GI complaints today. 03/30/2015 EGD/Colonscopy:  Indications:  Patient is 71 year old African-American male who presents with history of weight loss and nausea. His nausea has improved and he has gained a few pounds back. There is no history of vomiting abdominal pain melena or rectal bleeding. Family states positive for CRC and sister. He is undergoing diagnostic EGD and high risk screening colonoscopy.                                                                                   Impression:  EGD findings: Erosive reflux esophagitis and small sliding hiatal hernia. No evidence of peptic ulcer disease.  Colonoscopy findings: Normal colonoscopy except internal and external hemorrhoids.                       Review of Systems Past Medical History:  Diagnosis Date  . Family hx of colon cancer   . Hypertension   . Hypertension     Past Surgical History:  Procedure Laterality Date  . COLONOSCOPY N/A 03/29/2015   Procedure: COLONOSCOPY;  Surgeon: Malissa HippoNajeeb U Rehman, MD;  [facility]: [facility];  Service: Endoscopy;  Laterality: N/A;  7:30  . ESOPHAGOGASTRODUODENOSCOPY N/A 03/29/2015   Procedure: ESOPHAGOGASTRODUODENOSCOPY (EGD);  Surgeon: Malissa HippoNajeeb U Rehman, MD;  [facility]: [facility];  Service: Endoscopy;  Laterality: N/A;    No Known Allergies  Current Outpatient Medications on File Prior to Visit    Medication Sig Dispense Refill  . Apoaequorin (PREVAGEN PO) Take by mouth.    Marland Kitchen. aspirin 81 MG tablet Take 81 mg by mouth daily.    Marland Kitchen. lisinopril-hydrochlorothiazide (PRINZIDE,ZESTORETIC) 10-12.5 MG tablet Take 1 tablet by mouth daily.    . Multiple Vitamin (MULTIVITAMIN) tablet Take 1 tablet by mouth daily.    Marland Kitchen. omeprazole (PRILOSEC) 40 MG capsule Take 1 capsule (40 mg total) by mouth daily. 90 capsule 3   No current facility-administered medications on file prior to visit.         Objective:   Physical Exam Blood pressure 118/80, pulse 64, temperature 98 F (36.7 C), height 5\' 11"  (1.803 m), weight 151 lb 11.2 oz (68.8 kg). Alert and oriented. Skin warm and dry. Oral mucosa is moist.   . Sclera anicteric, conjunctivae is pink. Thyroid not enlarged. No cervical lymphadenopathy. Lungs clear. Heart regular rate and rhythm.  Abdomen is soft. Bowel sounds are positive. No hepatomegaly. No abdominal masses felt. No tenderness.  No edema to lower extremities.  Assessment & Plan:  Erosive esophagitis. He is doing well. Omeprazole controlling his symptoms.  Will get an H. Pylori today.as request of family member.

## 2017-03-20 NOTE — Patient Instructions (Signed)
H. Pylori

## 2017-04-04 LAB — HELICOBACTER PYLORI  SPECIAL ANTIGEN
MICRO NUMBER: 81392888
SPECIMEN QUALITY: ADEQUATE

## 2017-09-14 ENCOUNTER — Other Ambulatory Visit (INDEPENDENT_AMBULATORY_CARE_PROVIDER_SITE_OTHER): Payer: Self-pay | Admitting: Internal Medicine

## 2017-09-14 DIAGNOSIS — K2210 Ulcer of esophagus without bleeding: Secondary | ICD-10-CM

## 2017-09-14 DIAGNOSIS — K2100 Gastro-esophageal reflux disease with esophagitis, without bleeding: Secondary | ICD-10-CM

## 2017-10-15 ENCOUNTER — Ambulatory Visit (INDEPENDENT_AMBULATORY_CARE_PROVIDER_SITE_OTHER): Payer: Medicare Other | Admitting: Internal Medicine

## 2017-10-15 ENCOUNTER — Encounter (INDEPENDENT_AMBULATORY_CARE_PROVIDER_SITE_OTHER): Payer: Self-pay | Admitting: Internal Medicine

## 2017-10-15 VITALS — BP 120/82 | HR 72 | Temp 98.0°F | Ht 71.0 in | Wt 151.0 lb

## 2017-10-15 DIAGNOSIS — K219 Gastro-esophageal reflux disease without esophagitis: Secondary | ICD-10-CM | POA: Diagnosis not present

## 2017-10-15 DIAGNOSIS — K2210 Ulcer of esophagus without bleeding: Secondary | ICD-10-CM | POA: Diagnosis not present

## 2017-10-15 MED ORDER — OMEPRAZOLE 40 MG PO CPDR: 40.0000 mg | DELAYED_RELEASE_CAPSULE | Freq: Every day | ORAL | 3 refills | Status: DC

## 2017-10-15 NOTE — Progress Notes (Signed)
   Subjective:    Patient ID: Russell Reed, male    DOB: 08/12/1945, 72 y.o.   MRN: [id number]  HPIHere today for f/u. Last seen in November of 2 018. At OV he c/o GERD. H. Pylori was negative 04/03/2017.  GERD controlled with Omeprazole.  He tells me he is doing good. He is playing golf.  His appetite is good. He is eating 3-4 meals a day. He has maintained his weight. He has a BM daily. No melena or BRRB.    03/30/2015 EGD/Colonscopy:  Indications:Patient is 72 year old African-American male who presents with history of weight loss and nausea. His nausea has improved and he has gained a few pounds back. There is no history of vomiting abdominal pain melena or rectal bleeding. Family states positive for CRC and sister. He is undergoing diagnostic EGD and high risk screening colonoscopy. Impression:  EGD findings: Erosive reflux esophagitis and small sliding hiatal hernia. No evidence of peptic ulcer disease.  Colonoscopy findings: Normal colonoscopy except internal and external hemorrhoids.   Review of Systems Past Medical History:  Diagnosis Date  . Family hx of colon cancer   . Hypertension   . Hypertension     Past Surgical History:  Procedure Laterality Date  . COLONOSCOPY N/A 03/29/2015   Procedure: COLONOSCOPY;  Surgeon: Malissa HippoNajeeb U Rehman, MD;  [facility]: [facility];  Service: Endoscopy;  Laterality: N/A;  7:30  . ESOPHAGOGASTRODUODENOSCOPY N/A 03/29/2015   Procedure: ESOPHAGOGASTRODUODENOSCOPY (EGD);  Surgeon: Malissa HippoNajeeb U Rehman, MD;  Location: [facility];  Service: Endoscopy;  Laterality: N/A;    No Known Allergies  Current Outpatient Medications on File Prior to Visit  Medication Sig Dispense Refill  . Apoaequorin (PREVAGEN PO) Take by mouth.    Marland Kitchen. aspirin 81 MG tablet Take 81 mg by mouth daily.    Marland Kitchen. donepezil (ARICEPT) 5 MG tablet Take 5 mg by mouth at  bedtime.    Marland Kitchen. lisinopril-hydrochlorothiazide (PRINZIDE,ZESTORETIC) 10-12.5 MG tablet Take 1 tablet by mouth daily.    . Multiple Vitamin (MULTIVITAMIN) tablet Take 1 tablet by mouth daily.    Marland Kitchen. omeprazole (PRILOSEC) 40 MG capsule Take 1 capsule (40 mg total) by mouth daily. 90 capsule 3   No current facility-administered medications on file prior to visit.         Objective:   Physical Exam Blood pressure 120/82, pulse 72, temperature 98 F (36.7 C), height 5\' 11"  (1.803 m), weight 151 lb (68.5 kg). Alert and oriented. Skin warm and dry. Oral mucosa is moist.   . Sclera anicteric, conjunctivae is pink. Thyroid not enlarged. No cervical lymphadenopathy. Lungs clear. Heart regular rate and rhythm.  Abdomen is soft. Bowel sounds are positive. No hepatomegaly. No abdominal masses felt. No tenderness.  No edema to lower extremities.           Assessment & Plan:  GERD controlled with Omeprazole. He is doing well. OV in 1 year.

## 2017-10-15 NOTE — Patient Instructions (Addendum)
Continue the Omeprazole.  

## 2018-07-09 ENCOUNTER — Other Ambulatory Visit: Payer: Self-pay | Admitting: Ophthalmology

## 2018-07-09 ENCOUNTER — Other Ambulatory Visit (HOSPITAL_COMMUNITY): Payer: Self-pay | Admitting: Ophthalmology

## 2018-07-09 DIAGNOSIS — H47393 Other disorders of optic disc, bilateral: Secondary | ICD-10-CM

## 2018-07-15 ENCOUNTER — Ambulatory Visit (HOSPITAL_COMMUNITY)
Admission: RE | Admit: 2018-07-15 | Discharge: 2018-07-15 | Disposition: A | Discharge status: Home / Self Care | Payer: Medicare Other | Source: Ambulatory Visit | Attending: Ophthalmology | Admitting: Ophthalmology

## 2018-07-15 ENCOUNTER — Other Ambulatory Visit: Payer: Self-pay

## 2018-07-15 DIAGNOSIS — H47393 Other disorders of optic disc, bilateral: Secondary | ICD-10-CM | POA: Insufficient documentation

## 2018-07-15 LAB — POCT I-STAT CREATININE: Creatinine, Ser: 1 mg/dL (ref 0.61–1.24)

## 2018-07-15 MED ORDER — GADOBUTROL 1 MMOL/ML IV SOLN
7.0000 mL | Freq: Once | INTRAVENOUS | Status: AC | PRN
  Administered 2018-07-15: 7 mL via INTRAVENOUS

## 2018-10-16 ENCOUNTER — Encounter (INDEPENDENT_AMBULATORY_CARE_PROVIDER_SITE_OTHER): Payer: Self-pay | Admitting: Internal Medicine

## 2018-10-16 ENCOUNTER — Ambulatory Visit (INDEPENDENT_AMBULATORY_CARE_PROVIDER_SITE_OTHER): Payer: Medicare Other | Admitting: Internal Medicine

## 2018-10-16 ENCOUNTER — Other Ambulatory Visit: Payer: Self-pay

## 2018-10-16 VITALS — BP 136/84 | HR 74 | Temp 97.8°F | Ht 71.0 in | Wt 153.8 lb

## 2018-10-16 DIAGNOSIS — K219 Gastro-esophageal reflux disease without esophagitis: Secondary | ICD-10-CM | POA: Diagnosis not present

## 2018-10-16 NOTE — Patient Instructions (Signed)
OV in 1 year.  

## 2018-10-16 NOTE — Progress Notes (Signed)
   Subjective:    Patient ID: Russell Reed, male    DOB: [birth year], 73 y.o.   MRN: [id number]  HPI Here today for f/u. Last seen in June of 2019 for hx of chronic GERD. Maintained on Omeprazole. He is doing well. GERD is controlled. Appetite is good. He has gained 2.5 pounds since his last office visit. BMs are normal. No melena or BRRB.     03/30/2015 EGD/Colonscopy:  Indications:Patient is 73 year old African-American male who presents with history of weight loss and nausea. His nausea has improved and he has gained a few pounds back. There is no history of vomiting abdominal pain melena or rectal bleeding. Family states positive for CRC and sister. He is undergoing diagnostic EGD and high risk screening colonoscopy. Impression:  EGD findings: Erosive reflux esophagitis and small sliding hiatal hernia. No evidence of peptic ulcer disease.  Colonoscopy findings: Normal colonoscopy except internal and external hemorrhoids. Review of Systems Past Medical History:  Diagnosis Date  . Family hx of colon cancer   . Hypertension   . Hypertension     Past Surgical History:  Procedure Laterality Date  . COLONOSCOPY N/A 03/29/2015   Procedure: COLONOSCOPY;  Surgeon: Rogene Houston, MD;  [facility]: [facility];  Service: Endoscopy;  Laterality: N/A;  7:30  . ESOPHAGOGASTRODUODENOSCOPY N/A 03/29/2015   Procedure: ESOPHAGOGASTRODUODENOSCOPY (EGD);  Surgeon: Rogene Houston, MD;  [facility]: [facility];  Service: Endoscopy;  Laterality: N/A;    No Known Allergies  Current Outpatient Medications on File Prior to Visit  Medication Sig Dispense Refill  . Apoaequorin (PREVAGEN PO) Take by mouth.    Marland Kitchen aspirin 81 MG tablet Take 81 mg by mouth daily.    Marland Kitchen donepezil (ARICEPT) 5 MG tablet Take 5 mg by mouth at bedtime.    Marland Kitchen lisinopril-hydrochlorothiazide (PRINZIDE,ZESTORETIC) 10-12.5  MG tablet Take 1 tablet by mouth daily.    . Multiple Vitamin (MULTIVITAMIN) tablet Take 1 tablet by mouth daily.    Marland Kitchen omeprazole (PRILOSEC) 40 MG capsule Take 1 capsule (40 mg total) by mouth daily. 90 capsule 3   No current facility-administered medications on file prior to visit.         Objective:   Physical Exam Blood pressure 136/84, pulse 74, temperature 97.8 F (36.6 C), height 5\' 11"  (1.803 m), weight 153 lb 12.8 oz (69.8 kg). Alert and oriented. Skin warm and dry. Oral mucosa is moist.   . Sclera anicteric, conjunctivae is pink. Thyroid not enlarged. No cervical lymphadenopathy. Lungs clear. Heart regular rate and rhythm.  Abdomen is soft. Bowel sounds are positive. No hepatomegaly. No abdominal masses felt. No tenderness.  No edema to lower extremities        Assessment & Plan:  GERD. Continue the Omeprazole. He is doing well. OV in 1 year.

## 2018-11-17 ENCOUNTER — Other Ambulatory Visit (INDEPENDENT_AMBULATORY_CARE_PROVIDER_SITE_OTHER): Payer: Self-pay | Admitting: Internal Medicine

## 2018-11-17 DIAGNOSIS — K2210 Ulcer of esophagus without bleeding: Secondary | ICD-10-CM

## 2019-01-06 ENCOUNTER — Encounter (INDEPENDENT_AMBULATORY_CARE_PROVIDER_SITE_OTHER): Payer: Self-pay | Admitting: Nurse Practitioner

## 2019-01-06 ENCOUNTER — Ambulatory Visit (INDEPENDENT_AMBULATORY_CARE_PROVIDER_SITE_OTHER): Payer: Medicare Other | Admitting: Nurse Practitioner

## 2019-01-06 ENCOUNTER — Other Ambulatory Visit: Payer: Self-pay

## 2019-01-06 ENCOUNTER — Encounter (INDEPENDENT_AMBULATORY_CARE_PROVIDER_SITE_OTHER): Payer: Self-pay | Admitting: *Deleted

## 2019-01-06 VITALS — BP 117/81 | HR 75 | Temp 98.4°F | Ht 71.0 in | Wt 154.2 lb

## 2019-01-06 DIAGNOSIS — K219 Gastro-esophageal reflux disease without esophagitis: Secondary | ICD-10-CM

## 2019-01-06 DIAGNOSIS — R110 Nausea: Secondary | ICD-10-CM | POA: Diagnosis not present

## 2019-01-06 NOTE — Progress Notes (Signed)
Subjective:    Patient ID: Russell Reed, male    DOB: 10/26/1945, 73 y.o.   MRN: [id number]  HPI History of GERD, hypertension and memory changes. He presents today accompanied by his wife. He complains of having intermittent nausea for the past 3 to 4 months. He saw his PCP about 2 months ago and he recommended changing Omeprazole 68m from the am to at bed time. No improvement since taking Omeprazole at bed time. He and his wife report his nausea comes and goes, worse in the morning. He can go 2 to 3 days without having any nausea. He vomited x 1 three weeks ago in the morning. He ate a t[place]burger for dinner the previous night. No further vomiting since then. No weight loss. No early satiety. No upper or lower abdominal pain. He took AGarment/textile technologistonce or twice over the past 2 weeks with relief. Drinking ginger ale also reduces his nausea. No dysphagia or heartburn. He reports passing a normal formed brown stool once daily. No rectal bleeding or melena. He takes ASA 880mdaily. No other NSAIDS. No new medications. Sister with history of colon cancer. His most recent EGD and colonoscopy was 03/29/2015. The EGD showed erosive reflux esophagitis and a sliding hiatal hernia. The colonoscopy was normal except internal and external hemorrhoids. Recall colonoscopy in 5 years.   Past Medical History:  Diagnosis Date  . Family hx of colon cancer   . Hypertension   . Hypertension    Past Surgical History:  Procedure Laterality Date  . COLONOSCOPY N/A 03/29/2015   Procedure: COLONOSCOPY;  Surgeon: NaRogene HoustonMD;  [facility]: [facility];  Service: Endoscopy;  Laterality: N/A;  7:30  . ESOPHAGOGASTRODUODENOSCOPY N/A 03/29/2015   Procedure: ESOPHAGOGASTRODUODENOSCOPY (EGD);  Surgeon: NaRogene HoustonMD;  [facility]: [facility];  Service: Endoscopy;  Laterality: N/A;   Current Outpatient Medications on File Prior to Visit  Medication Sig Dispense Refill  . lisinopril-hydrochlorothiazide  (ZESTORETIC) 10-12.5 MG tablet Take 1 tablet by mouth daily.    . Marland Kitchenspirin 81 MG tablet Take 81 mg by mouth daily.    . Multiple Vitamin (MULTIVITAMIN) tablet Take 1 tablet by mouth daily.    . Marland Kitchenmeprazole (PRILOSEC) 40 MG capsule Take 1 capsule by mouth once daily 90 capsule 3   No current facility-administered medications on file prior to visit.    No Known Allergies    Objective:   Physical Exam  BP 117/81   Pulse 75   Temp 98.4 F (36.9 C)   Ht '5\' 11"'  (1.803 m)   Wt 154 lb 3.2 oz (69.9 kg)   BMI 21.51 kg/m  General: [age]ear old male well-developed in no acute distress Eyes: Sclera nonicteric, conjunctiva pink Mouth: Dentition intact Neck: Supple, no lymphadenopathy or thyromegaly Heart: Regular rate and rhythm, no murmurs Lungs: Breath sounds clear throughout Abdomen: Soft, nontender, mild thickness to the central epigastric area of aorta without palpable mass, no bruit, positive bowel sounds to all 4 quadrants, no HSM Extremities: No edema Neuro: Alert and oriented x3, no focal deficits, speech is clear, he responds to questions appropriately    Assessment & Plan:   1.[age]ear old male with nausea, 1 episode of vomiting 3 weeks ago without recurrence -Check abdominal sonogram to evaluate the liver and gallbladder -CBC, CMP, CRP and lipase -Take Omeprazole 40 mg 1 capsule 30 minutes prior to breakfast, if no improvement in 7 days then add Pepcid 20 mg 1 tablet at bedtime -Patient  declined anti-nausea medication, derail or ginger tea as needed -Patient to call office if his nausea worsens -Follow-up in the office in 2 months and as needed  2.  GERD, stable -Plan per #1  3.  Family history of colon cancer -Next colonoscopy due December 2021  4. ?  Dementia

## 2019-01-06 NOTE — Patient Instructions (Addendum)
1. Complete the ordered lab tests  2. Schedule an Abdominal sonogram   3. Omeprazole 40mg  one capsule to be taken in the morning 30 minutes before breakfast, if no improvement in 7 days then add Pepcid 20mg  one tab at bed time  4. Call our office if your nausea worsens or if you start to vomit

## 2019-01-07 LAB — CBC WITH DIFFERENTIAL/PLATELET
Absolute Monocytes: 567 cells/uL (ref 200–950)
Basophils Absolute: 63 cells/uL (ref 0–200)
Basophils Relative: 0.9 %
Eosinophils Absolute: 196 cells/uL (ref 15–500)
Eosinophils Relative: 2.8 %
HCT: 41.6 % (ref 38.5–50.0)
Hemoglobin: 14.6 g/dL (ref 13.2–17.1)
Lymphs Abs: 1855 cells/uL (ref 850–3900)
MCH: 33.7 pg — ABNORMAL HIGH (ref 27.0–33.0)
MCHC: 35.1 g/dL (ref 32.0–36.0)
MCV: 96.1 fL (ref 80.0–100.0)
MPV: 9.7 fL (ref 7.5–12.5)
Monocytes Relative: 8.1 %
Neutro Abs: 4319 cells/uL (ref 1500–7800)
Neutrophils Relative %: 61.7 %
Platelets: 240 10*3/uL (ref 140–400)
RBC: 4.33 10*6/uL (ref 4.20–5.80)
RDW: 14.4 % (ref 11.0–15.0)
Total Lymphocyte: 26.5 %
WBC: 7 10*3/uL (ref 3.8–10.8)

## 2019-01-07 LAB — COMPLETE METABOLIC PANEL WITH GFR
AG Ratio: 1.6 (calc) (ref 1.0–2.5)
ALT: 10 U/L (ref 9–46)
AST: 15 U/L (ref 10–35)
Albumin: 4.2 g/dL (ref 3.6–5.1)
Alkaline phosphatase (APISO): 52 U/L (ref 35–144)
BUN: 13 mg/dL (ref 7–25)
CO2: 34 mmol/L — ABNORMAL HIGH (ref 20–32)
Calcium: 9.2 mg/dL (ref 8.6–10.3)
Chloride: 100 mmol/L (ref 98–110)
Creat: 0.91 mg/dL (ref 0.70–1.18)
GFR, Est African American: 98 mL/min/{1.73_m2} (ref >=60)
GFR, Est Non African American: 84 mL/min/{1.73_m2} (ref >=60)
Globulin: 2.6 g/dL (calc) (ref 1.9–3.7)
Glucose, Bld: 114 mg/dL (ref 65–139)
Potassium: 4.1 mmol/L (ref 3.5–5.3)
Sodium: 139 mmol/L (ref 135–146)
Total Bilirubin: 0.4 mg/dL (ref 0.2–1.2)
Total Protein: 6.8 g/dL (ref 6.1–8.1)

## 2019-01-07 LAB — C-REACTIVE PROTEIN: CRP: 0.7 mg/L (ref <8.0)

## 2019-01-07 LAB — LIPASE: Lipase: 12 U/L (ref 7–60)

## 2019-01-10 ENCOUNTER — Other Ambulatory Visit: Payer: Self-pay

## 2019-01-10 ENCOUNTER — Ambulatory Visit (HOSPITAL_COMMUNITY)
Admission: RE | Admit: 2019-01-10 | Discharge: 2019-01-10 | Disposition: A | Discharge status: Home / Self Care | Payer: Medicare Other | Source: Ambulatory Visit | Attending: Nurse Practitioner | Admitting: Nurse Practitioner

## 2019-01-10 DIAGNOSIS — R110 Nausea: Secondary | ICD-10-CM | POA: Insufficient documentation

## 2019-01-15 ENCOUNTER — Telehealth (INDEPENDENT_AMBULATORY_CARE_PROVIDER_SITE_OTHER): Payer: Self-pay | Admitting: Nurse Practitioner

## 2019-01-15 NOTE — Telephone Encounter (Signed)
Wife called regarding test results  -  Ph# [phone]

## 2019-01-15 NOTE — Telephone Encounter (Signed)
I spoke with wife, we previously reviewed his lab results which were normal. Abd sono showed prox aorta a little enlarged and kidney changes, I advised pt to follow up with his pcp regarding the son findings, pt has appt with Dr. Berdine Addison in Oct. 2020.   Ann, pls fax a copy of his abd sono to Dr. Berdine Addison and mail pt a copy.

## 2019-01-16 NOTE — Telephone Encounter (Signed)
[place] report faxed to PCP and mailed to patient

## 2019-02-04 ENCOUNTER — Other Ambulatory Visit: Payer: Self-pay | Admitting: *Deleted

## 2019-02-04 DIAGNOSIS — Z20822 Contact with and (suspected) exposure to covid-19: Secondary | ICD-10-CM

## 2019-02-06 LAB — NOVEL CORONAVIRUS, NAA: SARS-CoV-2, NAA: NOT DETECTED

## 2019-03-10 ENCOUNTER — Ambulatory Visit (INDEPENDENT_AMBULATORY_CARE_PROVIDER_SITE_OTHER): Payer: Medicare Other | Admitting: Internal Medicine

## 2019-07-15 DIAGNOSIS — H401123 Primary open-angle glaucoma, left eye, severe stage: Secondary | ICD-10-CM | POA: Diagnosis not present

## 2019-07-15 DIAGNOSIS — H40021 Open angle with borderline findings, high risk, right eye: Secondary | ICD-10-CM | POA: Diagnosis not present

## 2019-07-15 DIAGNOSIS — H25811 Combined forms of age-related cataract, right eye: Secondary | ICD-10-CM | POA: Diagnosis not present

## 2019-08-16 DIAGNOSIS — R7309 Other abnormal glucose: Secondary | ICD-10-CM | POA: Diagnosis not present

## 2019-08-16 DIAGNOSIS — R7303 Prediabetes: Secondary | ICD-10-CM | POA: Diagnosis not present

## 2019-08-16 DIAGNOSIS — I10 Essential (primary) hypertension: Secondary | ICD-10-CM | POA: Diagnosis not present

## 2019-08-16 DIAGNOSIS — Z125 Encounter for screening for malignant neoplasm of prostate: Secondary | ICD-10-CM | POA: Diagnosis not present

## 2019-08-16 DIAGNOSIS — R799 Abnormal finding of blood chemistry, unspecified: Secondary | ICD-10-CM | POA: Diagnosis not present

## 2019-10-15 DIAGNOSIS — H401123 Primary open-angle glaucoma, left eye, severe stage: Secondary | ICD-10-CM | POA: Diagnosis not present

## 2019-10-15 DIAGNOSIS — H40021 Open angle with borderline findings, high risk, right eye: Secondary | ICD-10-CM | POA: Diagnosis not present

## 2019-10-21 ENCOUNTER — Ambulatory Visit (INDEPENDENT_AMBULATORY_CARE_PROVIDER_SITE_OTHER): Payer: Medicare Other | Admitting: Internal Medicine

## 2019-10-28 ENCOUNTER — Ambulatory Visit (INDEPENDENT_AMBULATORY_CARE_PROVIDER_SITE_OTHER): Payer: Medicare Other | Admitting: Gastroenterology

## 2019-10-28 ENCOUNTER — Other Ambulatory Visit: Payer: Self-pay

## 2019-10-28 ENCOUNTER — Encounter (INDEPENDENT_AMBULATORY_CARE_PROVIDER_SITE_OTHER): Payer: Self-pay | Admitting: Gastroenterology

## 2019-10-28 DIAGNOSIS — K219 Gastro-esophageal reflux disease without esophagitis: Secondary | ICD-10-CM

## 2019-10-28 MED ORDER — OMEPRAZOLE 40 MG PO CPDR: 40.0000 mg | DELAYED_RELEASE_CAPSULE | Freq: Every day | ORAL | 3 refills | Status: DC

## 2019-10-28 NOTE — Progress Notes (Signed)
Patient profile: Russell Reed is a 74 y.o. male seen for f/up. Last seen in clinic on September 2020.  He had labs and an ultrasound which were unremarkable.  History of Present Illness: Russell Reed is seen today and reports doing very well. He and wife report taking omeprazole 30 minutes before breakfast instead of after meals has completely resolved his symptoms.  He has no longer have any GERD symptoms nausea or vomiting.  He does remain upright after meals.  He has no dysphagia or epigastric pain.  He reports bowel movements are doing well and denies any constipation, diarrhea, melena, rectal bleeding.  Having 1-2 stools per day.  No complaints from patient or wife.  Wife reports patient is having some memory issues and PCP is following.  Non-smoker.  No alcohol.  Very rare NSAIDs.  Wt Readings from Last 3 Encounters:  10/28/19 153 lb (69.4 kg)  01/06/19 154 lb 3.2 oz (69.9 kg)  10/16/18 153 lb 12.8 oz (69.8 kg)     Last Colonoscopy: 2016-Prep excellent. Normal mucosa of cecum, ascending colon, hepatic flexure, transverse colon, splenic flexure, descending and sigmoid colon. Normal rectal mucosa. Small hemorrhoids above and below the dentate line   Last Endoscopy: 2016-Esophagus:  Mucosa of the esophagus was normal. Friable GE junction with two small erosions. No ring or stricture noted. GEJ:  39 cm Hiatus:  41 cm Stomach:  Stomach was empty and distended very well with insufflation. Folds in the proximal stomach were normal. Examination of mucosa at gastric body, antrum, pyloric channel, angularis fundus and cardia was normal. Duodenum:  Normal bulbar and post bulbar mucosa   Past Medical History:  Past Medical History:  Diagnosis Date  . Family hx of colon cancer   . Hypertension   . Hypertension   . Memory loss     Problem List: Patient Active Problem List   Diagnosis Date Noted  . GERD (gastroesophageal reflux disease) 09/28/2015  . Essential  hypertension 03/10/2015  . Nausea without vomiting 03/10/2015  . Loss of weight 03/10/2015    Past Surgical History: Past Surgical History:  Procedure Laterality Date  . COLONOSCOPY N/A 03/29/2015   Procedure: COLONOSCOPY;  Surgeon: Malissa Hippo, MD;  [facility]: [facility];  Service: Endoscopy;  Laterality: N/A;  7:30  . ESOPHAGOGASTRODUODENOSCOPY N/A 03/29/2015   Procedure: ESOPHAGOGASTRODUODENOSCOPY (EGD);  Surgeon: Malissa Hippo, MD;  [facility]: [facility];  Service: Endoscopy;  Laterality: N/A;    Allergies: No Known Allergies    Home Medications:  Current Outpatient Medications:  .  aspirin 81 MG tablet, Take 81 mg by mouth daily., Disp: , Rfl:  .  lisinopril-hydrochlorothiazide (ZESTORETIC) 10-12.5 MG tablet, Take 1 tablet by mouth daily., Disp: , Rfl:  .  Multiple Vitamin (MULTIVITAMIN) tablet, Take 1 tablet by mouth daily., Disp: , Rfl:  .  NON FORMULARY, pravagen taking 1 caps once a day, Disp: , Rfl:  .  omeprazole (PRILOSEC) 40 MG capsule, Take 1 capsule (40 mg total) by mouth daily., Disp: 90 capsule, Rfl: 3   Family History: family history includes Colon cancer in his sister.  Diagnosed in 55s.   Social History:   reports that he has never smoked. He has never used smokeless tobacco. He reports that he does not drink alcohol and does not use drugs.   Review of Systems: Constitutional: Denies weight loss/weight gain  Eyes: No changes in vision. ENT: No oral lesions, sore throat.  GI: see HPI.  Heme/Lymph: No easy  bruising.  CV: No chest pain.  GU: No hematuria.  Integumentary: No rashes.  Neuro: No headaches.  Psych: No depression/anxiety.  Endocrine: No heat/cold intolerance.  Allergic/Immunologic: No urticaria.  Resp: No cough, SOB.  Musculoskeletal: No joint swelling.    Physical Examination: BP 127/79 (BP Location: Left Arm, Patient Position: Sitting, Cuff Size: Normal)   Pulse 72   Temp 98.1 F (36.7 C) (Oral)   Ht 5\' 11"  (1.803 m)   Wt 153  lb (69.4 kg)   BMI 21.34 kg/m  Gen: NAD, alert and oriented x 4 HEENT: PEERLA, EOMI, Neck: supple, no JVD Chest: CTA bilaterally, no wheezes, crackles, or other adventitious sounds CV: RRR, no m/g/c/r Abd: soft, NT, ND, +BS in all four quadrants; no HSM, guarding, ridigity, or rebound tenderness Ext: no edema, well perfused with 2+ pulses, Skin: no rash or lesions noted on observed skin Lymph: no noted LAD  Data Reviewed:  Ultrasound September 2020-IMPRESSION: 1. Mild diffuse cortical thinning of both kidneys may reflect chronic kidney disease. 2. Proximal abdominal aorta measuring 3.5 cm. Recommend followup by October in 3 years    Assessment/Plan: Russell Reed is a 74 y.o. male    Russell Reed was seen today for 1 year follow up.  Diagnoses and all orders for this visit:  Gastroesophageal reflux disease, unspecified whether esophagitis present -     omeprazole (PRILOSEC) 40 MG capsule; Take 1 capsule (40 mg total) by mouth daily.     1.  GERD-well-controlled on omeprazole 40 mg 30 minutes before meals.  Was symptomatic when taking after meals.  He has no upper GI alarm symptoms such as dysphagia, etc.  He will continue his PPI  2.  Family history of colon cancer in his sister-due for repeat colonoscopy December 2021.  He denies any lower GI symptoms or prior issues with sedation.  Follow-up in office 1 year-sooner if needed  I personally performed the service, non-incident to. (WP)  January 2022, [facility]

## 2019-10-28 NOTE — Patient Instructions (Signed)
Continue omeprazole 30 min before meals. Due for colonoscopy 03/2020  GERD instructions: -Please avoid lying flat within 2 to 3 hours of eating, this will make reflux symptoms worse. -Some patients find elevating the head of the bed beneficial. -Avoid spicy greasy foods as well as caffeine, coffee, sodas-these food/drinks can worsen heartburn and reflux. -Tobacco will worsen reflux, please try to decrease/eliminate tobacco intake if applicable. -Avoid NSAID products (ibuprofen, aspirin, Advil, Aleve, Goody's, BCs, Alka-Seltzer) - if needing these occasionally please try to take with meal or snack to decrease stomach irritation. -If taking medication for reflux such as prilosec, nexium, aciphex, dexilant, prevacid - take 20-30 minutes before a meal for maximum effectiveness.

## 2020-01-03 DIAGNOSIS — I10 Essential (primary) hypertension: Secondary | ICD-10-CM | POA: Diagnosis not present

## 2020-01-03 DIAGNOSIS — M25511 Pain in right shoulder: Secondary | ICD-10-CM | POA: Diagnosis not present

## 2020-01-03 DIAGNOSIS — Z23 Encounter for immunization: Secondary | ICD-10-CM | POA: Diagnosis not present

## 2020-01-05 ENCOUNTER — Ambulatory Visit (HOSPITAL_COMMUNITY)
Admission: RE | Admit: 2020-01-05 | Discharge: 2020-01-05 | Disposition: A | Discharge status: Home / Self Care | Payer: Medicare PPO | Source: Ambulatory Visit | Attending: Family Medicine | Admitting: Family Medicine

## 2020-01-05 ENCOUNTER — Other Ambulatory Visit (HOSPITAL_COMMUNITY): Payer: Self-pay | Admitting: Family Medicine

## 2020-01-05 ENCOUNTER — Other Ambulatory Visit: Payer: Self-pay

## 2020-01-05 DIAGNOSIS — G8929 Other chronic pain: Secondary | ICD-10-CM | POA: Insufficient documentation

## 2020-01-05 DIAGNOSIS — M25511 Pain in right shoulder: Secondary | ICD-10-CM | POA: Diagnosis not present

## 2020-01-13 DIAGNOSIS — H401123 Primary open-angle glaucoma, left eye, severe stage: Secondary | ICD-10-CM | POA: Diagnosis not present

## 2020-01-13 DIAGNOSIS — H40021 Open angle with borderline findings, high risk, right eye: Secondary | ICD-10-CM | POA: Diagnosis not present

## 2020-01-13 DIAGNOSIS — H25811 Combined forms of age-related cataract, right eye: Secondary | ICD-10-CM | POA: Diagnosis not present

## 2020-01-13 DIAGNOSIS — H11043 Peripheral pterygium, stationary, bilateral: Secondary | ICD-10-CM | POA: Diagnosis not present

## 2020-02-24 DIAGNOSIS — Z7982 Long term (current) use of aspirin: Secondary | ICD-10-CM | POA: Diagnosis not present

## 2020-02-24 DIAGNOSIS — H409 Unspecified glaucoma: Secondary | ICD-10-CM | POA: Diagnosis not present

## 2020-02-24 DIAGNOSIS — G3184 Mild cognitive impairment, so stated: Secondary | ICD-10-CM | POA: Diagnosis not present

## 2020-02-24 DIAGNOSIS — Z87891 Personal history of nicotine dependence: Secondary | ICD-10-CM | POA: Diagnosis not present

## 2020-02-24 DIAGNOSIS — I10 Essential (primary) hypertension: Secondary | ICD-10-CM | POA: Diagnosis not present

## 2020-02-24 DIAGNOSIS — K219 Gastro-esophageal reflux disease without esophagitis: Secondary | ICD-10-CM | POA: Diagnosis not present

## 2020-03-22 ENCOUNTER — Encounter (INDEPENDENT_AMBULATORY_CARE_PROVIDER_SITE_OTHER): Payer: Self-pay | Admitting: *Deleted

## 2020-04-13 ENCOUNTER — Telehealth (INDEPENDENT_AMBULATORY_CARE_PROVIDER_SITE_OTHER): Payer: Self-pay

## 2020-04-13 ENCOUNTER — Other Ambulatory Visit (INDEPENDENT_AMBULATORY_CARE_PROVIDER_SITE_OTHER): Payer: Self-pay

## 2020-04-13 DIAGNOSIS — Z800 Family history of malignant neoplasm of digestive organs: Secondary | ICD-10-CM

## 2020-04-13 MED ORDER — NA SULFATE-K SULFATE-MG SULF 17.5-3.13-1.6 GM/177ML PO SOLN: 354.0000 mL | Freq: Once | ORAL | 0 refills | Status: AC

## 2020-04-13 NOTE — Telephone Encounter (Signed)
Referring MD/PCP: Hill   Procedure: tcs  Reason/Indication:  Family hx of colon ca  Has patient had this procedure before?  yes  If so, when, by whom and where?  2016  Is there a family history of colon cancer?  yes  Who?  What age when diagnosed? sister   Is patient diabetic?   no      Does patient have prosthetic heart valve or mechanical valve?  no  Do you have a pacemaker/defibrillator?  no  Has patient ever had endocarditis/atrial fibrillation? no  Have you had a stroke/heart attack last 6 mths? no  Does patient use oxygen? no  Has patient had joint replacement within last 12 months?  no  Is patient constipated or do they take laxatives? nono  Does patient have a history of alcohol/drug use?  no  Is patient on blood thinner such as Coumadin, Plavix and/or Aspirin? yes  Medications: Aspirin 81 mg daily, lisinopril/hctz 20/25 mg daily, omeprazole 40mg  daily, mvi daily, eye drops bid  Allergies: nkda  Medication Adjustment per Dr Rehman/Dr Aspirin 81 mg 2 days prior to procedure  Procedure date & time: 05/05/20 9:45

## 2020-04-13 NOTE — Telephone Encounter (Signed)
LeighAnn Real Cona, CMA  

## 2020-04-14 ENCOUNTER — Encounter (INDEPENDENT_AMBULATORY_CARE_PROVIDER_SITE_OTHER): Payer: Self-pay

## 2020-04-14 ENCOUNTER — Telehealth (INDEPENDENT_AMBULATORY_CARE_PROVIDER_SITE_OTHER): Payer: Self-pay

## 2020-04-14 MED ORDER — NA SULFATE-K SULFATE-MG SULF 17.5-3.13-1.6 GM/177ML PO SOLN: 354.0000 mL | Freq: Once | ORAL | 0 refills | Status: AC

## 2020-04-14 NOTE — Telephone Encounter (Signed)
Russell Reed, CMA  °

## 2020-04-28 NOTE — Patient Instructions (Signed)
Russell Reed  04/28/2020     @PREFPERIOPPHARMACY @   Your procedure is scheduled on   05/05/2020  Report to [facility] at  0830  A.M.  Call this number if you have problems the morning of surgery:  [phone]   Remember:  Follow the diet and prep instructions given to you by the office.                      Take these medicines the morning of surgery with A SIP OF WATER  Prilosec.    Do not wear jewelry, make-up or nail polish.  Do not wear lotions, powders, or perfumes. Please wear deodorant and brush your teeth.  Do not shave 48 hours prior to surgery.  Men may shave face and neck.  Do not bring valuables to the hospital.  [facility] is not responsible for any belongings or valuables.  Contacts, dentures or bridgework may not be worn into surgery.  Leave your suitcase in the car.  After surgery it may be brought to your room.  For patients admitted to the hospital, discharge time will be determined by your treatment team.  Patients discharged the day of surgery will not be allowed to drive home.   Name and phone number of your driver:   family Special instructions:  DO NOT smoke the morning of your procedure.  Please read over the following fact sheets that you were given. Anesthesia Post-op Instructions and Care and Recovery After Surgery       Colonoscopy, Adult, Care After This sheet gives you information about how to care for yourself after your procedure. Your health care provider may also give you more specific instructions. If you have problems or questions, contact your health care provider. What can I expect after the procedure? After the procedure, it is common to have:  A small amount of blood in your stool for 24 hours after the procedure.  Some gas.  Mild cramping or bloating of your abdomen. Follow these instructions at home: Eating and drinking   Drink enough fluid to keep your urine pale yellow.  Follow instructions from your  health care provider about eating or drinking restrictions.  Resume your normal diet as instructed by your health care provider. Avoid heavy or fried foods that are hard to digest. Activity  Rest as told by your health care provider.  Avoid sitting for a long time without moving. Get up to take short walks every 1-2 hours. This is important to improve blood flow and breathing. Ask for help if you feel weak or unsteady.  Return to your normal activities as told by your health care provider. Ask your health care provider what activities are safe for you. Managing cramping and bloating   Try walking around when you have cramps or feel bloated.  Apply heat to your abdomen as told by your health care provider. Use the heat source that your health care provider recommends, such as a moist heat pack or a heating pad. ? Place a towel between your skin and the heat source. ? Leave the heat on for 20-30 minutes. ? Remove the heat if your skin turns bright red. This is especially important if you are unable to feel pain, heat, or cold. You may have a greater risk of getting burned. General instructions  For the first 24 hours after the procedure: ? Do not drive or use machinery. ? Do not sign important  documents. ? Do not drink alcohol. ? Do your regular daily activities at a slower pace than normal. ? Eat soft foods that are easy to digest.  Take over-the-counter and prescription medicines only as told by your health care provider.  Keep all follow-up visits as told by your health care provider. This is important. Contact a health care provider if:  You have blood in your stool 2-3 days after the procedure. Get help right away if you have:  More than a small spotting of blood in your stool.  Large blood clots in your stool.  Swelling of your abdomen.  Nausea or vomiting.  A fever.  Increasing pain in your abdomen that is not relieved with medicine. Summary  After the procedure,  it is common to have a small amount of blood in your stool. You may also have mild cramping and bloating of your abdomen.  For the first 24 hours after the procedure, do not drive or use machinery, sign important documents, or drink alcohol.  Get help right away if you have a lot of blood in your stool, nausea or vomiting, a fever, or increased pain in your abdomen. This information is not intended to replace advice given to you by your health care provider. Make sure you discuss any questions you have with your health care provider. Document Revised: 11/04/2018 Document Reviewed: 11/04/2018 Elsevier Patient Education  [place] After These instructions provide you with information about caring for yourself after your procedure. Your health care provider may also give you more specific instructions. Your treatment has been planned according to current medical practices, but problems sometimes occur. Call your health care provider if you have any problems or questions after your procedure. What can I expect after the procedure? After your procedure, you may:  Feel sleepy for several hours.  Feel clumsy and have poor balance for several hours.  Feel forgetful about what happened after the procedure.  Have poor judgment for several hours.  Feel nauseous or vomit.  Have a sore throat if you had a breathing tube during the procedure. Follow these instructions at home: For at least 24 hours after the procedure:      Have a responsible adult stay with you. It is important to have someone help care for you until you are awake and alert.  Rest as needed.  Do not: ? Participate in activities in which you could fall or become injured. ? Drive. ? Use heavy machinery. ? Drink alcohol. ? Take sleeping pills or medicines that cause drowsiness. ? Make important decisions or sign legal documents. ? Take care of children on your own. Eating and  drinking  Follow the diet that is recommended by your health care provider.  If you vomit, drink water, juice, or soup when you can drink without vomiting.  Make sure you have little or no nausea before eating solid foods. General instructions  Take over-the-counter and prescription medicines only as told by your health care provider.  If you have sleep apnea, surgery and certain medicines can increase your risk for breathing problems. Follow instructions from your health care provider about wearing your sleep device: ? Anytime you are sleeping, including during daytime naps. ? While taking prescription pain medicines, sleeping medicines, or medicines that make you drowsy.  If you smoke, do not smoke without supervision.  Keep all follow-up visits as told by your health care provider. This is important. Contact a health care provider if:  You keep feeling nauseous or you keep vomiting.  You feel light-headed.  You develop a rash.  You have a fever. Get help right away if:  You have trouble breathing. Summary  For several hours after your procedure, you may feel sleepy and have poor judgment.  Have a responsible adult stay with you for at least 24 hours or until you are awake and alert. This information is not intended to replace advice given to you by your health care provider. Make sure you discuss any questions you have with your health care provider. Document Revised: 07/09/2017 Document Reviewed: 08/01/2015 Elsevier Patient Education  [place].

## 2020-04-29 ENCOUNTER — Other Ambulatory Visit: Payer: Self-pay

## 2020-04-29 ENCOUNTER — Encounter (HOSPITAL_COMMUNITY)
Admission: RE | Admit: 2020-04-29 | Discharge: 2020-04-29 | Disposition: A | Discharge status: Home / Self Care | Payer: Medicare PPO | Source: Ambulatory Visit | Attending: Internal Medicine | Admitting: Internal Medicine

## 2020-04-29 ENCOUNTER — Encounter (HOSPITAL_COMMUNITY): Payer: Self-pay

## 2020-04-29 DIAGNOSIS — Z01818 Encounter for other preprocedural examination: Secondary | ICD-10-CM | POA: Insufficient documentation

## 2020-05-03 ENCOUNTER — Other Ambulatory Visit: Payer: Self-pay

## 2020-05-03 ENCOUNTER — Other Ambulatory Visit (HOSPITAL_COMMUNITY)
Admission: RE | Admit: 2020-05-03 | Discharge: 2020-05-03 | Disposition: A | Discharge status: Home / Self Care | Payer: Medicare PPO | Source: Ambulatory Visit | Attending: Internal Medicine | Admitting: Internal Medicine

## 2020-05-03 DIAGNOSIS — Z20822 Contact with and (suspected) exposure to covid-19: Secondary | ICD-10-CM | POA: Diagnosis not present

## 2020-05-03 DIAGNOSIS — Z01812 Encounter for preprocedural laboratory examination: Secondary | ICD-10-CM | POA: Insufficient documentation

## 2020-05-03 LAB — SARS CORONAVIRUS 2 (TAT 6-24 HRS): SARS Coronavirus 2: NEGATIVE

## 2020-05-05 ENCOUNTER — Ambulatory Visit (HOSPITAL_COMMUNITY): Payer: Medicare PPO | Admitting: Anesthesiology

## 2020-05-05 ENCOUNTER — Encounter (HOSPITAL_COMMUNITY)
Admission: RE | Disposition: A | Discharge status: Home / Self Care | Payer: Self-pay | Source: Home / Self Care | Attending: Internal Medicine

## 2020-05-05 ENCOUNTER — Ambulatory Visit (HOSPITAL_COMMUNITY)
Admission: RE | Admit: 2020-05-05 | Discharge: 2020-05-05 | Disposition: A | Discharge status: Home / Self Care | Payer: Medicare PPO | Attending: Internal Medicine | Admitting: Internal Medicine

## 2020-05-05 ENCOUNTER — Encounter (HOSPITAL_COMMUNITY): Payer: Self-pay | Admitting: Internal Medicine

## 2020-05-05 DIAGNOSIS — Z7982 Long term (current) use of aspirin: Secondary | ICD-10-CM | POA: Insufficient documentation

## 2020-05-05 DIAGNOSIS — K644 Residual hemorrhoidal skin tags: Secondary | ICD-10-CM | POA: Diagnosis not present

## 2020-05-05 DIAGNOSIS — Z79899 Other long term (current) drug therapy: Secondary | ICD-10-CM | POA: Insufficient documentation

## 2020-05-05 DIAGNOSIS — Z800 Family history of malignant neoplasm of digestive organs: Secondary | ICD-10-CM | POA: Diagnosis not present

## 2020-05-05 DIAGNOSIS — Z1211 Encounter for screening for malignant neoplasm of colon: Secondary | ICD-10-CM | POA: Diagnosis not present

## 2020-05-05 HISTORY — PX: COLONOSCOPY WITH PROPOFOL: SHX5780

## 2020-05-05 LAB — HM COLONOSCOPY

## 2020-05-05 SURGERY — COLONOSCOPY WITH PROPOFOL
Anesthesia: General

## 2020-05-05 MED ORDER — LACTATED RINGERS IV SOLN
Freq: Once | INTRAVENOUS | Status: AC
  Administered 2020-05-05: 1000 mL via INTRAVENOUS

## 2020-05-05 MED ORDER — PROPOFOL 500 MG/50ML IV EMUL
INTRAVENOUS | Status: DC | PRN
  Administered 2020-05-05: 150 ug/kg/min via INTRAVENOUS

## 2020-05-05 MED ORDER — CHLORHEXIDINE GLUCONATE CLOTH 2 % EX PADS: 6.0000 | MEDICATED_PAD | Freq: Once | CUTANEOUS | Status: DC

## 2020-05-05 MED ORDER — PHENYLEPHRINE 40 MCG/ML (10ML) SYRINGE FOR IV PUSH (FOR BLOOD PRESSURE SUPPORT)
PREFILLED_SYRINGE | INTRAVENOUS | Status: AC
  Filled 2020-05-05: qty 10

## 2020-05-05 MED ORDER — STERILE WATER FOR IRRIGATION IR SOLN: Status: DC | PRN

## 2020-05-05 MED ORDER — PHENYLEPHRINE 40 MCG/ML (10ML) SYRINGE FOR IV PUSH (FOR BLOOD PRESSURE SUPPORT)
PREFILLED_SYRINGE | INTRAVENOUS | Status: DC | PRN
  Administered 2020-05-05 (×3): 120 ug via INTRAVENOUS
  Administered 2020-05-05: 40 ug via INTRAVENOUS

## 2020-05-05 MED ORDER — EPHEDRINE SULFATE-NACL 50-0.9 MG/10ML-% IV SOSY
PREFILLED_SYRINGE | INTRAVENOUS | Status: DC | PRN
  Administered 2020-05-05: 10 mg via INTRAVENOUS
  Administered 2020-05-05: 5 mg via INTRAVENOUS

## 2020-05-05 MED ORDER — PROPOFOL 10 MG/ML IV BOLUS
INTRAVENOUS | Status: DC | PRN
  Administered 2020-05-05: 100 mg via INTRAVENOUS

## 2020-05-05 MED ORDER — LIDOCAINE HCL (CARDIAC) PF 100 MG/5ML IV SOSY
PREFILLED_SYRINGE | INTRAVENOUS | Status: DC | PRN
  Administered 2020-05-05: 50 mg via INTRAVENOUS

## 2020-05-05 MED ORDER — EPHEDRINE 5 MG/ML INJ
INTRAVENOUS | Status: AC
  Filled 2020-05-05: qty 10

## 2020-05-05 MED ORDER — LIDOCAINE HCL (PF) 2 % IJ SOLN
INTRAMUSCULAR | Status: AC
  Filled 2020-05-05: qty 5

## 2020-05-05 MED ORDER — LACTATED RINGERS IV SOLN: INTRAVENOUS | Status: DC | PRN

## 2020-05-05 NOTE — Anesthesia Procedure Notes (Signed)
Date/Time: 05/05/2020 9:56 AM Performed by: Julian Reil, CRNA Pre-anesthesia Checklist: Patient identified, Emergency Drugs available, Suction available and Patient being monitored Patient Re-evaluated:Patient Re-evaluated prior to induction Oxygen Delivery Method: Nasal cannula Induction Type: IV induction Placement Confirmation: positive ETCO2

## 2020-05-05 NOTE — Anesthesia Postprocedure Evaluation (Signed)
Anesthesia Post Note  Patient: Russell Reed  Procedure(s) Performed: COLONOSCOPY WITH PROPOFOL (N/A )  Patient location during evaluation: PACU Anesthesia Type: General Level of consciousness: awake and alert Pain management: pain level controlled Vital Signs Assessment: post-procedure vital signs reviewed and stable Respiratory status: spontaneous breathing, nonlabored ventilation and respiratory function stable Cardiovascular status: blood pressure returned to baseline and stable Postop Assessment: no apparent nausea or vomiting Anesthetic complications: no   No complications documented.   Last Vitals:  Vitals:   05/05/20 1030 05/05/20 1040  BP: 101/64 111/80  Pulse: 67 74  Resp: 18 16  Temp:  36.7 C  SpO2: 100% 100%    Last Pain:  Vitals:   05/05/20 1040  TempSrc: Oral  PainSc: 0-No pain                 Julian Reil

## 2020-05-05 NOTE — Transfer of Care (Signed)
Immediate Anesthesia Transfer of Care Note  Patient: PRATT BRESS  Procedure(s) Performed: COLONOSCOPY WITH PROPOFOL (N/A )  Patient Location: PACU  Anesthesia Type:General  Level of Consciousness: drowsy  Airway & Oxygen Therapy: Patient Spontanous Breathing and Patient connected to nasal cannula oxygen  Post-op Assessment: Report given to RN and Post -op Vital signs reviewed and stable  Post vital signs: Reviewed and stable  Last Vitals:  Vitals Value Taken Time  BP    Temp    Pulse 62 05/05/20 1020  Resp 16 05/05/20 1020  SpO2 100 % 05/05/20 1020  Vitals shown include unvalidated device data.  Last Pain:  Vitals:   05/05/20 0851  TempSrc: Oral  PainSc: 0-No pain      Patients Stated Pain Goal: 8 (05/05/20 0851)  Complications: No complications documented.

## 2020-05-05 NOTE — H&P (Signed)
Russell Reed is an 75 y.o. male.   Chief Complaint: Patient is here for colonoscopy. HPI: Patient is 75 year old African-American male who is here for her screening colonoscopy.  He denies abdominal pain change in bowel habits or rectal bleeding.  He states very active.  He says he lifts weights walks regularly and may be jog maybe once a week. History significant for CRC in his sister.  He states she is in her 27s.  Not sure her age at the time of diagnosis.  Past Medical History:  Diagnosis Date  . Family hx of colon cancer   . Hypertension   . Hypertension   . Memory loss     Past Surgical History:  Procedure Laterality Date  . COLONOSCOPY N/A 03/29/2015   Procedure: COLONOSCOPY;  Surgeon: Malissa Hippo, MD;  Location: [facility];  Service: Endoscopy;  Laterality: N/A;  7:30  . ESOPHAGOGASTRODUODENOSCOPY N/A 03/29/2015   Procedure: ESOPHAGOGASTRODUODENOSCOPY (EGD);  Surgeon: Malissa Hippo, MD;  Location: [facility];  Service: Endoscopy;  Laterality: N/A;    Family History  Problem Relation Age of Onset  . Colon cancer Sister    Social History:  reports that he has never smoked. He has never used smokeless tobacco. He reports that he does not drink alcohol and does not use drugs.  Allergies: No Known Allergies  Medications Prior to Admission  Medication Sig Dispense Refill  . aspirin 81 MG tablet Take 81 mg by mouth daily.    . Dorzolamide HCl-Timolol Mal PF 2-0.5 % SOLN Place 1 drop into both eyes in the morning and at bedtime.    Marland Kitchen lisinopril-hydrochlorothiazide (ZESTORETIC) 20-25 MG tablet Take 1 tablet by mouth daily.    . Multiple Vitamin (MULTIVITAMIN) tablet Take 1 tablet by mouth daily.    . NON FORMULARY Take 1 capsule by mouth daily. pravagen taking 1 caps once a day    . omeprazole (PRILOSEC) 40 MG capsule Take 1 capsule (40 mg total) by mouth daily. 90 capsule 3    Results for orders placed or performed during the hospital encounter of 05/03/20 (from  the past 48 hour(s))  SARS CORONAVIRUS 2 (TAT 6-24 HRS) Nasopharyngeal Nasopharyngeal Swab     Status: None   Collection Time: 05/03/20  1:00 PM   Specimen: Nasopharyngeal Swab  Result Value Ref Range   SARS Coronavirus 2 NEGATIVE NEGATIVE    Comment: (NOTE) SARS-CoV-2 target nucleic acids are NOT DETECTED.  The SARS-CoV-2 RNA is generally detectable in upper and lower respiratory specimens during the acute phase of infection. Negative results do not preclude SARS-CoV-2 infection, do not rule out co-infections with other pathogens, and should not be used as the sole basis for treatment or other patient management decisions. Negative results must be combined with clinical observations, patient history, and epidemiological information. The expected result is Negative.  Fact Sheet for Patients: HairSlick.no  Fact Sheet for Healthcare Providers: quierodirigir.com  This test is not yet approved or cleared by the [place] FDA and  has been authorized for detection and/or diagnosis of SARS-CoV-2 by FDA under an Emergency Use Authorization (EUA). This EUA will remain  in effect (meaning this test can be used) for the duration of the COVID-19 declaration under Se ction 564(b)(1) of the Act, 21 U.S.C. section 360bbb-3(b)(1), unless the authorization is terminated or revoked sooner.  Performed at [facility], [address] [place], [place] [address]    No results found.  Review of Systems  Blood  pressure 127/80, temperature 98.2 F (36.8 C), temperature source Oral, resp. rate 20, SpO2 98 %. Physical Exam HENT:     Mouth/Throat:     Mouth: Mucous membranes are moist.     Pharynx: Oropharynx is clear.  Eyes:     General: No scleral icterus.    Conjunctiva/sclera: Conjunctivae normal.  Cardiovascular:     Rate and Rhythm: Normal rate and regular rhythm.     Heart sounds: Normal heart sounds. No murmur  heard.   Pulmonary:     Effort: Pulmonary effort is normal.     Breath sounds: Normal breath sounds.  Abdominal:     General: Abdomen is flat.     Palpations: Abdomen is soft. There is no mass.     Tenderness: There is no abdominal tenderness.  Musculoskeletal:        General: No swelling.     Cervical back: Neck supple.  Lymphadenopathy:     Cervical: No cervical adenopathy.  Skin:    General: Skin is dry.  Neurological:     Mental Status: He is alert.      Assessment/Plan  High risk screening colonoscopy. History of colon carcinoma in first-degree relative.  Lionel December, MD 05/05/2020, 9:49 AM

## 2020-05-05 NOTE — Anesthesia Preprocedure Evaluation (Signed)
Anesthesia Evaluation  Patient identified by MRN, date of birth, ID band Patient awake    Reviewed: Allergy & Precautions, NPO status , Patient's Chart, lab work & pertinent test results  History of Anesthesia Complications Negative for: history of anesthetic complications  Airway Mallampati: II  TM Distance: >3 FB Neck ROM: Full    Dental  (+) Dental Advisory Given, Missing   Pulmonary neg pulmonary ROS,    Pulmonary exam normal breath sounds clear to auscultation       Cardiovascular Exercise Tolerance: Good hypertension, Pt. on medications Normal cardiovascular exam Rhythm:Regular Rate:Normal     Neuro/Psych Dementia negative neurological ROS  negative psych ROS   GI/Hepatic Neg liver ROS, GERD  Medicated and Controlled,  Endo/Other  negative endocrine ROS  Renal/GU negative Renal ROS  negative genitourinary   Musculoskeletal negative musculoskeletal ROS (+)   Abdominal   Peds negative pediatric ROS (+)  Hematology negative hematology ROS (+)   Anesthesia Other Findings   Reproductive/Obstetrics negative OB ROS                            Anesthesia Physical Anesthesia Plan  ASA: II  Anesthesia Plan: General   Post-op Pain Management:    Induction: Intravenous  PONV Risk Score and Plan: TIVA  Airway Management Planned: Nasal Cannula and Natural Airway  Additional Equipment:   Intra-op Plan:   Post-operative Plan:   Informed Consent: I have reviewed the patients History and Physical, chart, labs and discussed the procedure including the risks, benefits and alternatives for the proposed anesthesia with the patient or authorized representative who has indicated his/her understanding and acceptance.     Dental advisory given  Plan Discussed with: CRNA and Surgeon  Anesthesia Plan Comments:         Anesthesia Quick Evaluation

## 2020-05-05 NOTE — Discharge Instructions (Signed)
Resume usual medications and diet as before No driving for 24 hours. Office visit in 5 years to determine need for colonoscopy.      Colonoscopy, Adult, Care After This sheet gives you information about how to care for yourself after your procedure. Your doctor may also give you more specific instructions. If you have problems or questions, call your doctor. What can I expect after the procedure? After the procedure, it is common to have:  A small amount of blood in your poop (stool) for 24 hours.  Some gas.  Mild cramping or bloating in your belly (abdomen). Follow these instructions at home: Eating and drinking  Drink enough fluid to keep your pee (urine) pale yellow.  Follow instructions from your doctor about what you cannot eat or drink.  Return to your normal diet as told by your doctor. Avoid heavy or fried foods that are hard to digest.   Activity  Rest as told by your doctor.  Do not sit for a long time without moving. Get up to take short walks every 1-2 hours. This is important. Ask for help if you feel weak or unsteady.  Return to your normal activities as told by your doctor. Ask your doctor what activities are safe for you. To help cramping and bloating:  Try walking around.  Put heat on your belly as told by your doctor. Use the heat source that your doctor recommends, such as a moist heat pack or a heating pad. ? Put a towel between your skin and the heat source. ? Leave the heat on for 20-30 minutes. ? Remove the heat if your skin turns bright red. This is very important if you are unable to feel pain, heat, or cold. You may have a greater risk of getting burned.   General instructions  If you were given a medicine to help you relax (sedative) during your procedure, it can affect you for many hours. Do not drive or use machinery until your doctor says that it is safe.  For the first 24 hours after the procedure: ? Do not sign important documents. ? Do  not drink alcohol. ? Do your daily activities more slowly than normal. ? Eat foods that are soft and easy to digest.  Take over-the-counter or prescription medicines only as told by your doctor.  Keep all follow-up visits as told by your doctor. This is important. Contact a doctor if:  You have blood in your poop 2-3 days after the procedure. Get help right away if:  You have more than a small amount of blood in your poop.  You see large clumps of tissue (blood clots) in your poop.  Your belly is swollen.  You feel like you may vomit (nauseous).  You vomit.  You have a fever.  You have belly pain that gets worse, and medicine does not help your pain. Summary  After the procedure, it is common to have a small amount of blood in your poop. You may also have mild cramping and bloating in your belly.  If you were given a medicine to help you relax (sedative) during your procedure, it can affect you for many hours. Do not drive or use machinery until your doctor says that it is safe.  Get help right away if you have a lot of blood in your poop, feel like you may vomit, have a fever, or have more belly pain. This information is not intended to replace advice given to you by your  health care provider. Make sure you discuss any questions you have with your health care provider. Document Revised: 02/14/2019 Document Reviewed: 11/04/2018 Elsevier Patient Education  2021 Elsevier Inc.      Monitored Anesthesia Care, Care After This sheet gives you information about how to care for yourself after your procedure. Your health care provider may also give you more specific instructions. If you have problems or questions, contact your health care provider. What can I expect after the procedure? After the procedure, it is common to have:  Tiredness.  Forgetfulness about what happened after the procedure.  Impaired judgment for important decisions.  Nausea or vomiting.  Some  difficulty with balance. Follow these instructions at home: For the time period you were told by your health care provider:  Rest as needed.  Do not participate in activities where you could fall or become injured.  Do not drive or use machinery.  Do not drink alcohol.  Do not take sleeping pills or medicines that cause drowsiness.  Do not make important decisions or sign legal documents.  Do not take care of children on your own.      Eating and drinking  Follow the diet that is recommended by your health care provider.  Drink enough fluid to keep your urine pale yellow.  If you vomit: ? Drink water, juice, or soup when you can drink without vomiting. ? Make sure you have little or no nausea before eating solid foods. General instructions  Have a responsible adult stay with you for the time you are told. It is important to have someone help care for you until you are awake and alert.  Take over-the-counter and prescription medicines only as told by your health care provider.  If you have sleep apnea, surgery and certain medicines can increase your risk for breathing problems. Follow instructions from your health care provider about wearing your sleep device: ? Anytime you are sleeping, including during daytime naps. ? While taking prescription pain medicines, sleeping medicines, or medicines that make you drowsy.  Avoid smoking.  Keep all follow-up visits as told by your health care provider. This is important. Contact a health care provider if:  You keep feeling nauseous or you keep vomiting.  You feel light-headed.  You are still sleepy or having trouble with balance after 24 hours.  You develop a rash.  You have a fever.  You have redness or swelling around the IV site. Get help right away if:  You have trouble breathing.  You have new-onset confusion at home. Summary  For several hours after your procedure, you may feel tired. You may also be  forgetful and have poor judgment.  Have a responsible adult stay with you for the time you are told. It is important to have someone help care for you until you are awake and alert.  Rest as told. Do not drive or operate machinery. Do not drink alcohol or take sleeping pills.  Get help right away if you have trouble breathing, or if you suddenly become confused. This information is not intended to replace advice given to you by your health care provider. Make sure you discuss any questions you have with your health care provider. Document Revised: 12/25/2019 Document Reviewed: 03/13/2019 Elsevier Patient Education  2021 [employer].

## 2020-05-05 NOTE — Op Note (Signed)
[facility] Patient Name: Russell Reed Procedure Date: 05/05/2020 9:40 AM MRN: [id number] Date of Birth: 03/29/1946 Attending MD: Lionel DecemberNajeeb Dacen Frayre , MD CSN: [id number] Age: 75 Admit Type: Outpatient Procedure:                Colonoscopy Indications:              Screening in patient at increased risk: Colorectal                            cancer in sister 3860 or older Providers:                Lionel DecemberNajeeb Aadhav Uhlig, MD, Nena PolioLisa Moore, RN, Cyril MourningJay Christopher                            Tech, Technician Referring MD:             Mirna MiresGerald Hill, MD Medicines:                Propofol per Anesthesia Complications:            No immediate complications. Estimated Blood Loss:     Estimated blood loss: none. Procedure:                Pre-Anesthesia Assessment:                           - Prior to the procedure, a History and Physical                            was performed, and patient medications and                            allergies were reviewed. The patient's tolerance of                            previous anesthesia was also reviewed. The risks                            and benefits of the procedure and the sedation                            options and risks were discussed with the patient.                            All questions were answered, and informed consent                            was obtained. Prior Anticoagulants: The patient has                            taken no previous anticoagulant or antiplatelet                            agents except for aspirin. ASA Grade Assessment: II                            -  A patient with mild systemic disease. After                            reviewing the risks and benefits, the patient was                            deemed in satisfactory condition to undergo the                            procedure.                           After obtaining informed consent, the colonoscope                            was passed under direct vision.  Throughout the                            procedure, the patient's blood pressure, pulse, and                            oxygen saturations were monitored continuously. The                            [id number]-HQ190L ([id number]) scope was introduced through                            the anus and advanced to the the cecum, identified                            by appendiceal orifice and ileocecal valve. The                            colonoscopy was technically difficult and complex                            due to a redundant colon and significant looping.                            Successful completion of the procedure was aided by                            using manual pressure, withdrawing and reinserting                            the scope and scope guide. The patient tolerated                            the procedure well. The quality of the bowel                            preparation was excellent. The ileocecal valve,  appendiceal orifice, and rectum were photographed. Scope In: 9:56:53 AM Scope Out: 10:14:48 AM Scope Withdrawal Time: 0 hours 5 minutes 25 seconds  Total Procedure Duration: 0 hours 17 minutes 55 seconds  Findings:      The perianal and digital rectal examinations were normal.      The colon (entire examined portion) appeared normal.      External hemorrhoids were found during retroflexion. The hemorrhoids       were small. Impression:               - The entire examined colon is normal.                           - External hemorrhoids.                           - No specimens collected. Moderate Sedation:      Per Anesthesia Care Recommendation:           - Patient has a contact number available for                            emergencies. The signs and symptoms of potential                            delayed complications were discussed with the                            patient. Return to normal activities tomorrow.                             Written discharge instructions were provided to the                            patient.                           - Resume previous diet today.                           - Continue present medications.                           - No recommendation at this time regarding repeat                            colonoscopy at this time.                           - Return to GI office in 5 years. Procedure Code(s):        --- Professional ---                           [id number], Colonoscopy, flexible; diagnostic, including                            collection of specimen(s) by brushing or washing,  when performed (separate procedure) Diagnosis Code(s):        --- Professional ---                           Z80.0, Family history of malignant neoplasm of                            digestive organs                           K64.4, Residual hemorrhoidal skin tags CPT copyright 2019 American [employer] Association. All rights reserved. The codes documented in this report are preliminary and upon coder review may  be revised to meet current compliance requirements. Lionel December, MD Lionel December, MD 05/05/2020 10:25:20 AM This report has been signed electronically. Number of Addenda: 0

## 2020-05-07 ENCOUNTER — Encounter (HOSPITAL_COMMUNITY): Payer: Self-pay | Admitting: Internal Medicine

## 2020-05-12 ENCOUNTER — Encounter (INDEPENDENT_AMBULATORY_CARE_PROVIDER_SITE_OTHER): Payer: Self-pay | Admitting: *Deleted

## 2020-05-12 DIAGNOSIS — H40021 Open angle with borderline findings, high risk, right eye: Secondary | ICD-10-CM | POA: Diagnosis not present

## 2020-05-12 DIAGNOSIS — H401123 Primary open-angle glaucoma, left eye, severe stage: Secondary | ICD-10-CM | POA: Diagnosis not present

## 2020-05-12 DIAGNOSIS — H11043 Peripheral pterygium, stationary, bilateral: Secondary | ICD-10-CM | POA: Diagnosis not present

## 2020-05-12 DIAGNOSIS — H25811 Combined forms of age-related cataract, right eye: Secondary | ICD-10-CM | POA: Diagnosis not present

## 2020-05-18 DIAGNOSIS — Z125 Encounter for screening for malignant neoplasm of prostate: Secondary | ICD-10-CM | POA: Diagnosis not present

## 2020-05-18 DIAGNOSIS — N400 Enlarged prostate without lower urinary tract symptoms: Secondary | ICD-10-CM | POA: Diagnosis not present

## 2020-08-16 DIAGNOSIS — H25811 Combined forms of age-related cataract, right eye: Secondary | ICD-10-CM | POA: Diagnosis not present

## 2020-08-16 DIAGNOSIS — H40021 Open angle with borderline findings, high risk, right eye: Secondary | ICD-10-CM | POA: Diagnosis not present

## 2020-08-16 DIAGNOSIS — H5213 Myopia, bilateral: Secondary | ICD-10-CM | POA: Diagnosis not present

## 2020-08-16 DIAGNOSIS — H524 Presbyopia: Secondary | ICD-10-CM | POA: Diagnosis not present

## 2020-08-16 DIAGNOSIS — H401123 Primary open-angle glaucoma, left eye, severe stage: Secondary | ICD-10-CM | POA: Diagnosis not present

## 2020-08-16 DIAGNOSIS — H11043 Peripheral pterygium, stationary, bilateral: Secondary | ICD-10-CM | POA: Diagnosis not present

## 2020-08-16 DIAGNOSIS — H52203 Unspecified astigmatism, bilateral: Secondary | ICD-10-CM | POA: Diagnosis not present

## 2020-10-23 DIAGNOSIS — K219 Gastro-esophageal reflux disease without esophagitis: Secondary | ICD-10-CM | POA: Diagnosis not present

## 2020-10-23 DIAGNOSIS — E785 Hyperlipidemia, unspecified: Secondary | ICD-10-CM | POA: Diagnosis not present

## 2020-10-23 DIAGNOSIS — I10 Essential (primary) hypertension: Secondary | ICD-10-CM | POA: Diagnosis not present

## 2020-10-28 ENCOUNTER — Other Ambulatory Visit (INDEPENDENT_AMBULATORY_CARE_PROVIDER_SITE_OTHER): Payer: Self-pay

## 2020-10-28 ENCOUNTER — Ambulatory Visit (INDEPENDENT_AMBULATORY_CARE_PROVIDER_SITE_OTHER): Payer: Medicare PPO | Admitting: Gastroenterology

## 2020-10-28 ENCOUNTER — Other Ambulatory Visit: Payer: Self-pay

## 2020-10-28 ENCOUNTER — Encounter (INDEPENDENT_AMBULATORY_CARE_PROVIDER_SITE_OTHER): Payer: Self-pay | Admitting: Gastroenterology

## 2020-10-28 VITALS — BP 122/77 | HR 83 | Temp 98.1°F | Ht 71.0 in | Wt 153.0 lb

## 2020-10-28 DIAGNOSIS — K2100 Gastro-esophageal reflux disease with esophagitis, without bleeding: Secondary | ICD-10-CM

## 2020-10-28 MED ORDER — OMEPRAZOLE 20 MG PO CPDR: 20.0000 mg | DELAYED_RELEASE_CAPSULE | Freq: Every day | ORAL | 3 refills | Status: DC

## 2020-10-28 MED ORDER — OMEPRAZOLE 20 MG PO CPDR
20.0000 mg | DELAYED_RELEASE_CAPSULE | Freq: Every day | ORAL | 3 refills | Status: DC
Start: 2020-10-28 — End: 2020-10-28

## 2020-10-28 NOTE — Patient Instructions (Signed)
Decrease omeprazole to 20 mg qday

## 2020-10-28 NOTE — Progress Notes (Signed)
Russell Reed, M.D. Gastroenterology & Hepatology [facility] [address] [place], [place] [address]  Primary Care Physician: Mirna Mires, MD [address] [place] [address]  I will communicate my assessment and recommendations to the referring MD via EMR.  Problems: GERD H/o colon cancer  History of Present Illness: Russell Reed is a 75 y.o. male with past medical history of hypertension, memory loss, history of colon cancer, and GERD, who presents for follow up of GERD.  The patient was last seen on 10/28/2019. At that time, the patient was scheduled for colonoscopy with finding his clopidogrel.  He was continued on omeprazole 40 mg daily .  The patient comes with his wife who provides most of the information.She denies any nausea, heartburn, dysphagia, vomiting, fever, chills, hematochezia, melena, hematemesis, abdominal distention, abdominal pain, diarrhea, jaundice, pruritus or weight loss.6 the patient has been taking the medication every day in the morning, most of the x30 minutes before breakfast compliantly.  Last Colonoscopy:04/2020  - The entire examined colon is normal. - External hemorrhoids.  Past Medical History: Past Medical History:  Diagnosis Date   Family hx of colon cancer    Hypertension    Hypertension    Memory loss     Past Surgical History: Past Surgical History:  Procedure Laterality Date   COLONOSCOPY N/A 03/29/2015   Procedure: COLONOSCOPY;  Surgeon: Malissa Hippo, MD;  [facility];  Service: Endoscopy;  Laterality: N/A;  7:30   COLONOSCOPY WITH PROPOFOL N/A 05/05/2020   Procedure: COLONOSCOPY WITH PROPOFOL;  Surgeon: Malissa Hippo, MD;  Location: [facility];  Service: Endoscopy;  Laterality: N/A;  9:45   ESOPHAGOGASTRODUODENOSCOPY N/A 03/29/2015   Procedure: ESOPHAGOGASTRODUODENOSCOPY (EGD);  Surgeon: Malissa Hippo, MD;  Location: [facility];  Service:  Endoscopy;  Laterality: N/A;    Family History: Family History  Problem Relation Age of Onset   Colon cancer Sister     Social History: Social History   Tobacco Use  Smoking Status Never  Smokeless Tobacco Never   Social History   Substance and Sexual Activity  Alcohol Use No   Social History   Substance and Sexual Activity  Drug Use No    Allergies: No Known Allergies  Medications: Current Outpatient Medications  Medication Sig Dispense Refill   aspirin 81 MG tablet Take 81 mg by mouth daily.     Dorzolamide HCl-Timolol Mal PF 2-0.5 % SOLN Place 1 drop into both eyes in the morning and at bedtime.     lisinopril-hydrochlorothiazide (ZESTORETIC) 20-25 MG tablet Take 1 tablet by mouth daily.     Multiple Vitamin (MULTIVITAMIN) tablet Take 1 tablet by mouth daily.     NON FORMULARY Take 1 capsule by mouth daily. pravagen taking 1 caps once a day     omeprazole (PRILOSEC) 40 MG capsule Take 1 capsule (40 mg total) by mouth daily. 90 capsule 3   No current facility-administered medications for this visit.    Review of Systems: GENERAL: negative for malaise, night sweats HEENT: No changes in hearing or vision, no nose bleeds or other nasal problems. NECK: Negative for lumps, goiter, pain and significant neck swelling RESPIRATORY: Negative for cough, wheezing CARDIOVASCULAR: Negative for chest pain, leg swelling, palpitations, orthopnea GI: SEE HPI MUSCULOSKELETAL: Negative for joint pain or swelling, back pain, and muscle pain. SKIN: Negative for lesions, rash PSYCH: Negative for sleep disturbance, mood disorder and recent psychosocial stressors. HEMATOLOGY Negative  for prolonged bleeding, bruising easily, and swollen nodes. ENDOCRINE: Negative for cold or heat intolerance, polyuria, polydipsia and goiter. NEURO: negative for tremor, gait imbalance, syncope and seizures. The remainder of the review of systems is noncontributory.   Physical Exam: BP 122/77 (BP  Location: Left Arm, Patient Position: Sitting, Cuff Size: Small)   Pulse 83   Temp 98.1 F (36.7 C) (Oral)   Ht 5\' 11"  (1.803 m)   Wt 153 lb (69.4 kg)   BMI 21.34 kg/m  GENERAL: The patient is AO x3, in no acute distress. HEENT: Head is normocephalic and atraumatic. EOMI are intact. Mouth is well hydrated and without lesions. NECK: Supple. No masses LUNGS: Clear to auscultation. No presence of rhonchi/wheezing/rales. Adequate chest expansion HEART: RRR, normal s1 and s2. ABDOMEN: Soft, nontender, no guarding, no peritoneal signs, and nondistended. BS +. No masses. EXTREMITIES: Without any cyanosis, clubbing, rash, lesions or edema. NEUROLOGIC: AOx3, no focal motor deficit. SKIN: no jaundice, no rashes  Imaging/Labs: as above  I personally reviewed and interpreted the available labs, imaging and endoscopic files.  Impression and Plan: Russell Reed is a 74 y.o. male with past medical history of hypertension, memory loss, history of colon cancer, and GERD, who presents for follow up of GERD.  The patient has tolerated the medication adequately and this helps control his symptoms.  I explained to the wife that we will attempt to decrease the dose of the medication to 20 mg every day which I agreed.  If he has recurrence of symptoms we will go back to 40 mg daily.  -Decrease omeprazole to 20 mg qday -RTC 1 YEAR  All questions were answered.      66, MD Gastroenterology and Hepatology [facility]

## 2020-12-21 DIAGNOSIS — Z809 Family history of malignant neoplasm, unspecified: Secondary | ICD-10-CM | POA: Diagnosis not present

## 2020-12-21 DIAGNOSIS — Z8249 Family history of ischemic heart disease and other diseases of the circulatory system: Secondary | ICD-10-CM | POA: Diagnosis not present

## 2020-12-21 DIAGNOSIS — K219 Gastro-esophageal reflux disease without esophagitis: Secondary | ICD-10-CM | POA: Diagnosis not present

## 2020-12-21 DIAGNOSIS — G3184 Mild cognitive impairment, so stated: Secondary | ICD-10-CM | POA: Diagnosis not present

## 2020-12-21 DIAGNOSIS — I10 Essential (primary) hypertension: Secondary | ICD-10-CM | POA: Diagnosis not present

## 2020-12-21 DIAGNOSIS — Z7982 Long term (current) use of aspirin: Secondary | ICD-10-CM | POA: Diagnosis not present

## 2020-12-21 DIAGNOSIS — H409 Unspecified glaucoma: Secondary | ICD-10-CM | POA: Diagnosis not present

## 2021-02-15 DIAGNOSIS — H40021 Open angle with borderline findings, high risk, right eye: Secondary | ICD-10-CM | POA: Diagnosis not present

## 2021-02-15 DIAGNOSIS — H401123 Primary open-angle glaucoma, left eye, severe stage: Secondary | ICD-10-CM | POA: Diagnosis not present

## 2021-05-02 DIAGNOSIS — Z7982 Long term (current) use of aspirin: Secondary | ICD-10-CM | POA: Diagnosis not present

## 2021-05-02 DIAGNOSIS — I10 Essential (primary) hypertension: Secondary | ICD-10-CM | POA: Diagnosis not present

## 2021-05-02 DIAGNOSIS — K219 Gastro-esophageal reflux disease without esophagitis: Secondary | ICD-10-CM | POA: Diagnosis not present

## 2021-05-02 DIAGNOSIS — G3184 Mild cognitive impairment, so stated: Secondary | ICD-10-CM | POA: Diagnosis not present

## 2021-05-02 DIAGNOSIS — Z809 Family history of malignant neoplasm, unspecified: Secondary | ICD-10-CM | POA: Diagnosis not present

## 2021-05-02 DIAGNOSIS — H409 Unspecified glaucoma: Secondary | ICD-10-CM | POA: Diagnosis not present

## 2021-05-02 DIAGNOSIS — H3530 Unspecified macular degeneration: Secondary | ICD-10-CM | POA: Diagnosis not present

## 2021-05-07 DIAGNOSIS — F03A0 Unspecified dementia, mild, without behavioral disturbance, psychotic disturbance, mood disturbance, and anxiety: Secondary | ICD-10-CM | POA: Diagnosis not present

## 2021-05-07 DIAGNOSIS — E785 Hyperlipidemia, unspecified: Secondary | ICD-10-CM | POA: Diagnosis not present

## 2021-05-07 DIAGNOSIS — K219 Gastro-esophageal reflux disease without esophagitis: Secondary | ICD-10-CM | POA: Diagnosis not present

## 2021-05-07 DIAGNOSIS — I10 Essential (primary) hypertension: Secondary | ICD-10-CM | POA: Diagnosis not present

## 2021-07-09 DIAGNOSIS — N400 Enlarged prostate without lower urinary tract symptoms: Secondary | ICD-10-CM | POA: Diagnosis not present

## 2021-07-09 DIAGNOSIS — Z125 Encounter for screening for malignant neoplasm of prostate: Secondary | ICD-10-CM | POA: Diagnosis not present

## 2021-08-17 DIAGNOSIS — H40021 Open angle with borderline findings, high risk, right eye: Secondary | ICD-10-CM | POA: Diagnosis not present

## 2021-08-17 DIAGNOSIS — H5212 Myopia, left eye: Secondary | ICD-10-CM | POA: Diagnosis not present

## 2021-08-17 DIAGNOSIS — H25811 Combined forms of age-related cataract, right eye: Secondary | ICD-10-CM | POA: Diagnosis not present

## 2021-08-17 DIAGNOSIS — H11043 Peripheral pterygium, stationary, bilateral: Secondary | ICD-10-CM | POA: Diagnosis not present

## 2021-08-17 DIAGNOSIS — H401123 Primary open-angle glaucoma, left eye, severe stage: Secondary | ICD-10-CM | POA: Diagnosis not present

## 2021-08-30 ENCOUNTER — Other Ambulatory Visit (INDEPENDENT_AMBULATORY_CARE_PROVIDER_SITE_OTHER): Payer: Self-pay | Admitting: Gastroenterology

## 2021-08-30 DIAGNOSIS — K2100 Gastro-esophageal reflux disease with esophagitis, without bleeding: Secondary | ICD-10-CM

## 2021-11-14 ENCOUNTER — Other Ambulatory Visit: Payer: Self-pay

## 2021-11-14 ENCOUNTER — Encounter (HOSPITAL_COMMUNITY): Payer: Self-pay | Admitting: Emergency Medicine

## 2021-11-14 ENCOUNTER — Emergency Department (HOSPITAL_COMMUNITY): Payer: Medicare PPO

## 2021-11-14 ENCOUNTER — Emergency Department (HOSPITAL_COMMUNITY)
Admission: EM | Admit: 2021-11-14 | Discharge: 2021-11-14 | Disposition: A | Discharge status: Home / Self Care | Payer: Medicare PPO | Attending: Emergency Medicine | Admitting: Emergency Medicine

## 2021-11-14 DIAGNOSIS — I10 Essential (primary) hypertension: Secondary | ICD-10-CM | POA: Diagnosis present

## 2021-11-14 DIAGNOSIS — J90 Pleural effusion, not elsewhere classified: Secondary | ICD-10-CM | POA: Diagnosis not present

## 2021-11-14 DIAGNOSIS — R531 Weakness: Secondary | ICD-10-CM | POA: Diagnosis present

## 2021-11-14 DIAGNOSIS — E860 Dehydration: Secondary | ICD-10-CM | POA: Insufficient documentation

## 2021-11-14 DIAGNOSIS — U071 COVID-19: Secondary | ICD-10-CM | POA: Diagnosis not present

## 2021-11-14 DIAGNOSIS — Z7982 Long term (current) use of aspirin: Secondary | ICD-10-CM | POA: Insufficient documentation

## 2021-11-14 DIAGNOSIS — R059 Cough, unspecified: Secondary | ICD-10-CM | POA: Diagnosis not present

## 2021-11-14 DIAGNOSIS — I700 Atherosclerosis of aorta: Secondary | ICD-10-CM | POA: Diagnosis not present

## 2021-11-14 DIAGNOSIS — R918 Other nonspecific abnormal finding of lung field: Secondary | ICD-10-CM | POA: Diagnosis not present

## 2021-11-14 DIAGNOSIS — F0390 Unspecified dementia without behavioral disturbance: Secondary | ICD-10-CM | POA: Diagnosis present

## 2021-11-14 LAB — COMPREHENSIVE METABOLIC PANEL
ALT: 30 U/L (ref 0–44)
AST: 53 U/L — ABNORMAL HIGH (ref 15–41)
Albumin: 3.5 g/dL (ref 3.5–5.0)
Alkaline Phosphatase: 54 U/L (ref 38–126)
Anion gap: 9 (ref 5–15)
BUN: 29 mg/dL — ABNORMAL HIGH (ref 8–23)
CO2: 30 mmol/L (ref 22–32)
Calcium: 9 mg/dL (ref 8.9–10.3)
Chloride: 99 mmol/L (ref 98–111)
Creatinine, Ser: 1.1 mg/dL (ref 0.61–1.24)
GFR, Estimated: >60 mL/min (ref >60)
Glucose, Bld: 142 mg/dL — ABNORMAL HIGH (ref 70–99)
Potassium: 3.9 mmol/L (ref 3.5–5.1)
Sodium: 138 mmol/L (ref 135–145)
Total Bilirubin: 1 mg/dL (ref 0.3–1.2)
Total Protein: 7.8 g/dL (ref 6.5–8.1)

## 2021-11-14 LAB — CBC WITH DIFFERENTIAL/PLATELET
Abs Immature Granulocytes: 0.02 10*3/uL (ref 0.00–0.07)
Basophils Absolute: 0 10*3/uL (ref 0.0–0.1)
Basophils Relative: 0 %
Eosinophils Absolute: 0 10*3/uL (ref 0.0–0.5)
Eosinophils Relative: 0 %
HCT: 43.3 % (ref 39.0–52.0)
Hemoglobin: 15 g/dL (ref 13.0–17.0)
Immature Granulocytes: 0 %
Lymphocytes Relative: 18 %
Lymphs Abs: 1.2 10*3/uL (ref 0.7–4.0)
MCH: 30.1 pg (ref 26.0–34.0)
MCHC: 34.6 g/dL (ref 30.0–36.0)
MCV: 86.8 fL (ref 80.0–100.0)
Monocytes Absolute: 0.5 10*3/uL (ref 0.1–1.0)
Monocytes Relative: 7 %
Neutro Abs: 5 10*3/uL (ref 1.7–7.7)
Neutrophils Relative %: 75 %
Platelets: 193 10*3/uL (ref 150–400)
RBC: 4.99 MIL/uL (ref 4.22–5.81)
RDW: 12.8 % (ref 11.5–15.5)
WBC: 6.7 10*3/uL (ref 4.0–10.5)
nRBC: 0 % (ref 0.0–0.2)

## 2021-11-14 LAB — D-DIMER, QUANTITATIVE: D-Dimer, Quant: 1.31 ug/mL-FEU — ABNORMAL HIGH (ref 0.00–0.50)

## 2021-11-14 MED ORDER — SODIUM CHLORIDE 0.9 % IV BOLUS
2000.0000 mL | Freq: Once | INTRAVENOUS | Status: AC
  Administered 2021-11-14: 2000 mL via INTRAVENOUS

## 2021-11-14 MED ORDER — IOHEXOL 350 MG/ML SOLN
80.0000 mL | Freq: Once | INTRAVENOUS | Status: AC | PRN
  Administered 2021-11-14: 80 mL via INTRAVENOUS

## 2021-11-14 MED ORDER — PREDNISONE 20 MG PO TABS: 40.0000 mg | ORAL_TABLET | Freq: Every day | ORAL | 0 refills | Status: AC

## 2021-11-14 MED ORDER — SODIUM CHLORIDE 0.9 % IV SOLN: INTRAVENOUS | Status: DC

## 2021-11-14 MED ORDER — AMLODIPINE BESYLATE 2.5 MG PO TABS: 2.5000 mg | ORAL_TABLET | Freq: Every day | ORAL | 11 refills | Status: DC

## 2021-11-14 MED ORDER — ACETAMINOPHEN 650 MG RE SUPP
650.0000 mg | Freq: Once | RECTAL | Status: DC
  Filled 2021-11-14: qty 1

## 2021-11-14 MED ORDER — ACETAMINOPHEN 325 MG PO TABS: 650.0000 mg | ORAL_TABLET | ORAL | 2 refills | Status: AC | PRN

## 2021-11-14 MED ORDER — SODIUM CHLORIDE 0.9 % IV BOLUS
500.0000 mL | Freq: Once | INTRAVENOUS | Status: AC
  Administered 2021-11-14: 500 mL via INTRAVENOUS

## 2021-11-14 MED ORDER — METHYLPREDNISOLONE SODIUM SUCC 125 MG IJ SOLR
125.0000 mg | Freq: Once | INTRAMUSCULAR | Status: AC
Start: 2021-11-14 — End: 2021-11-14
  Administered 2021-11-14: 125 mg via INTRAVENOUS
  Filled 2021-11-14: qty 2

## 2021-11-14 NOTE — Discharge Instructions (Signed)
1)You are strongly advised to isolate/quarantine for at least 10 days from the date of your diagnosis with COVID-19 infection--please always wear a mask if you have to go outside the house  2) Please take medications as prescribed  3)Video/Virtual follow-up visit with primary care physician in about a week advised  4)Encourage adequate fluid and caloric intake

## 2021-11-14 NOTE — ED Triage Notes (Signed)
Per sister in law, pt diagnosed with Covid on last Friday, unable to get him to take meds, not eating or drinking or having productive cough.  PMH of short term memory lost.

## 2021-11-14 NOTE — ED Notes (Signed)
Patient given 2 cups of water.  Patient currently drinking now when entering room.

## 2021-11-14 NOTE — ED Notes (Signed)
Patient transported to CT 

## 2021-11-14 NOTE — ED Provider Notes (Signed)
Signout from Dr. Effie Shy.  76 year old male with recent diagnosis of COVID brought in by family for evaluation of malaise confusion.  Decreased appetite decreased p.o. intake.  Cough.  Chest x-ray concerning for pneumonia.  Patient is pending CT angio chest and reassessment. Physical Exam  BP 122/75   Pulse 70   Temp 98.5 F (36.9 C) (Oral)   Resp 17   Ht 5\' 11"  (1.803 m)   Wt 71.7 kg   SpO2 94%   BMI 22.04 kg/m   Physical Exam  Procedures  Procedures  ED Course / MDM    Medical Decision Making Amount and/or Complexity of Data Reviewed Labs: ordered. Radiology: ordered.  Risk Prescription drug management. Decision regarding hospitalization.   CT angio does not show any evidence of PE.  There are some groundglass opacities.  Infectious versus inflammatory.  Afebrile here.  Patient's caregiver does not feel he is going to do well at home.  She is asking for him to be observed overnight and hydrated.  Discussed with [facility] hospitalist Dr. .  He had a long discussion with patient and caregiver and ultimately the plan was to be discharged home       Mariea Clonts, MD 11/15/21 [phone]

## 2021-11-14 NOTE — ED Notes (Signed)
Patient drank entire container of ensure.

## 2021-11-14 NOTE — ED Provider Notes (Signed)
[facility] EMERGENCY DEPARTMENT Provider Note   CSN: [id number] Arrival date & time: 11/14/21  1206     History  Chief Complaint  Patient presents with   Weakness    Russell Reed is a 76 y.o. male.  HPI Elderly male presenting for malaise and confusion, with sister-in-law giving history.  He tested positive for COVID, home test, 11/11/2021.  That day his PCP sent a prescription in for Paxlovid.  He has been taking that but has difficulty swallowing it partly because of confusion and possibly due to sore throat.  He has decreased appetite.  He has been somewhat agitated which is unusual for him according to his sister-in-law.  He has been coughing but not producing sputum.  He has been vaccinated boosted for COVID prophylaxis.    Home Medications Prior to Admission medications   Medication Sig Start Date End Date Taking? Authorizing Provider  aspirin 81 MG tablet Take 81 mg by mouth daily.    [provider]  Dorzolamide HCl-Timolol Mal PF 2-0.5 % SOLN Place 1 drop into both eyes in the morning and at bedtime.    [provider]  lisinopril-hydrochlorothiazide (ZESTORETIC) 20-25 MG tablet Take 1 tablet by mouth daily.    [provider]  Multiple Vitamin (MULTIVITAMIN) tablet Take 1 tablet by mouth daily.    [provider]  NON FORMULARY Take 1 capsule by mouth daily. pravagen taking 1 caps once a day    [provider]  omeprazole (PRILOSEC) 20 MG capsule TAKE 1 CAPSULE (20 MG TOTAL) BY MOUTH DAILY. 08/31/21   Harvel Quale, MD      Allergies    Patient has no known allergies.    Review of Systems   Review of Systems  Physical Exam Updated Vital Signs BP 122/75   Pulse 70   Temp 98.2 F (36.8 C) (Oral)   Resp 17   Ht 5\' 11"  (1.803 m)   Wt 71.7 kg   SpO2 94%   BMI 22.04 kg/m  Physical Exam Vitals and nursing note reviewed.  Constitutional:      General: He is not in acute distress.    Appearance: He is  well-developed. He is not ill-appearing, toxic-appearing or diaphoretic.  HENT:     Head: Normocephalic and atraumatic.     Right Ear: External ear normal.     Left Ear: External ear normal.  Eyes:     Conjunctiva/sclera: Conjunctivae normal.     Pupils: Pupils are equal, round, and reactive to light.  Neck:     Trachea: Phonation normal.  Cardiovascular:     Rate and Rhythm: Normal rate.  Pulmonary:     Effort: Pulmonary effort is normal.  Abdominal:     General: There is no distension.     Palpations: Abdomen is soft.     Tenderness: There is no abdominal tenderness.  Musculoskeletal:        General: Normal range of motion.     Cervical back: Normal range of motion and neck supple.  Skin:    General: Skin is warm and dry.  Neurological:     Mental Status: He is alert.     Cranial Nerves: No cranial nerve deficit.     Sensory: No sensory deficit.     Motor: No abnormal muscle tone.     Coordination: Coordination normal.     Comments: Cooperative, confused  Psychiatric:        Mood and Affect: Mood normal.  Behavior: Behavior normal.     ED Results / Procedures / Treatments   Labs (all labs ordered are listed, but only abnormal results are displayed) Labs Reviewed  COMPREHENSIVE METABOLIC PANEL - Abnormal; Notable for the following components:      Result Value   Glucose, Bld 142 (*)    BUN 29 (*)    AST 53 (*)    All other components within normal limits  D-DIMER, QUANTITATIVE - Abnormal; Notable for the following components:   D-Dimer, Quant 1.31 (*)    All other components within normal limits  CBC WITH DIFFERENTIAL/PLATELET    EKG None  Radiology CT Angio Chest PE W/Cm &/Or Wo Cm  Result Date: 11/14/2021 CLINICAL DATA:  Pulmonary embolus suspected. EXAM: CT ANGIOGRAPHY CHEST WITH CONTRAST TECHNIQUE: Multidetector CT imaging of the chest was performed using the standard protocol during bolus administration of intravenous contrast. Multiplanar CT  image reconstructions and MIPs were obtained to evaluate the vascular anatomy. RADIATION DOSE REDUCTION: This exam was performed according to the departmental dose-optimization program which includes automated exposure control, adjustment of the mA and/or kV according to patient size and/or use of iterative reconstruction technique. CONTRAST:  48mL OMNIPAQUE IOHEXOL 350 MG/ML SOLN COMPARISON:  None Available. FINDINGS: Cardiovascular: No evidence of pulmonary embolus, although evaluation of the segmental and subsegmental pulmonary arteries in the lower lungs is limited due to respiratory motion artifact. Normal heart size. No pericardial effusion. Normal caliber thoracic aorta with atherosclerotic disease. Mediastinum/Nodes: Thyroid is unremarkable. Esophagus is unremarkable. No pathologically enlarged lymph nodes seen in the chest. Lungs/Pleura: Central airways are patent. Patchy ground-glass opacities and ground-glass nodules are seen throughout the right lung. No pleural effusion or pneumothorax. Upper Abdomen: No acute abnormality. Musculoskeletal: No chest wall abnormality. No acute or significant osseous findings. Review of the MIP images confirms the above findings. IMPRESSION: 1. No evidence of pulmonary embolus, although evaluation of the segmental and subsegmental pulmonary arteries in the lower lungs is limited due to respiratory motion artifact. 2. Patchy ground-glass opacities and ground-glass nodules are seen throughout the right lung, likely infectious or inflammatory. Recommend follow-up chest CT in 3 months to ensure resolution. 3.  Aortic Atherosclerosis (ICD10-I70.0). Electronically Signed   By: Allegra Lai M.D.   On: 11/14/2021 16:02   DG Chest Portable 1 View  Result Date: 11/14/2021 CLINICAL DATA:  Cough.  Diagnosed with COVID 3 days ago. EXAM: PORTABLE CHEST 1 VIEW COMPARISON:  Radiographs 07/06/2014. FINDINGS: 1238 hours. The heart size and mediastinal contours are stable. There is  mild patchy airspace disease at the right lung base associated with a small right pleural effusion. The left lung appears clear. There is no pneumothorax. No acute osseous findings. Stable asymmetric glenohumeral degenerative changes on the right and mild thoracic spondylosis. IMPRESSION: New patchy right basilar airspace disease and small right pleural effusion suspicious for early pneumonia. Electronically Signed   By: Carey Bullocks M.D.   On: 11/14/2021 12:48    Procedures Procedures    Medications Ordered in ED Medications  0.9 %  sodium chloride infusion ( Intravenous New Bag/Given 11/14/21 1550)  sodium chloride 0.9 % bolus 500 mL (0 mLs Intravenous Stopped 11/14/21 1518)  iohexol (OMNIPAQUE) 350 MG/ML injection 80 mL (80 mLs Intravenous Contrast Given 11/14/21 1535)    ED Course/ Medical Decision Making/ A&P                           Medical Decision Making Patient presenting  for malaise, weakness, confusion, in the face of COVID infection.  He is taking Paxlovid.  He lives with his elderly wife who also has COVID.  He is here with his sister-in-law who gives history.  He is taking Paxlovid as prescribed but having some difficulty swallowing it and other medicines and taking and usual food.  He has been more agitated than usual.  He apparently has chronic dementia.  Amount and/or Complexity of Data Reviewed Independent Historian: caregiver    Details: Sister-in-law is at the bedside and gives history Labs: ordered.    Details: CBC, metabolic panel, D-dimer-normal except D-dimer high, BUN high, glucose high Radiology: ordered and independent interpretation performed.    Details: Chest x-ray -- patchy right lower lobe infiltrate with effusion  Risk Prescription drug management. Decision regarding hospitalization. Risk Details: Patient with recent COVID diagnosis, presenting with malaise, weakness and altered mental status.  D-dimer elevated.  He was given IV fluids for suspected  mild dehydration on clinical history.  Anticipate getting CT angiogram chest to rule out PE.  Care transitioned to Dr. Charm Barges for further work-up and disposition.           Final Clinical Impression(s) / ED Diagnoses Final diagnoses:  COVID-19 virus infection    Rx / DC Orders ED Discharge Orders     None         Mancel Bale, MD 11/14/21 [phone]

## 2021-11-14 NOTE — Consult Note (Signed)
Patient Demographics:    Russell Reed, is a 76 y.o. male  MRN: [id number]   DOB - 30-Jun-1945  Admit Date - 11/14/2021  Outpatient Primary MD for the patient is Mirna Mires, MD   Assessment & Plan:   Assessment and Plan:   1) COVID-19 pneumonia--- no hypoxia, no fevers... D-dimer mildly elevated -CTA chest and chest x-ray noted with COVID-pneumonia but no PE -No significant cough or respiratory distress -CBC WNL -Diagnosed on 11/11/2021 -Wife was diagnosed with COVID on 11/09/2021 -patient was started on Paxlovid as outpatient on 11/11/2021 -Patient received IV steroids in the ED -Okay to discharge home on p.o. prednisone -Okay to complete Paxlovid  2)Dehydration Concerns--- poor appetite, poor p.o. intake in the setting of COVID-19 infection and underlying dementia -BUN/creatinine ratio up -Stop lisinopril HCTZ -Patient received IV fluids in the ED -Wife and sister-in-law will try to encourage patient to drink boost/Ensure and other caloric liquids  3)HTN--BP stable at this time -Stop lisinopril HCTZ as above -May use amlodipine 2.5 mg if BP becomes elevated  --Plan of care discussed with patient's sister-in-law at bedside in person and patient's wife by speaker phone (three-way conversation)  Allergies as of 11/14/2021   No Known Allergies      Medication List     STOP taking these medications    lisinopril-hydrochlorothiazide 20-25 MG tablet Commonly known as: ZESTORETIC       TAKE these medications    acetaminophen 325 MG tablet Commonly known as: Tylenol Take 2 tablets (650 mg total) by mouth every 4 (four) hours as needed.   amLODipine 2.5 MG tablet Commonly known as: NORVASC Take 1 tablet (2.5 mg total) by mouth daily. Hold if SBP < 120 mmhg Start taking on: November 21, 2021    aspirin 81 MG tablet Take 81 mg by mouth daily.   Dorzolamide HCl-Timolol Mal PF 2-0.5 % Soln Place 1 drop into both eyes in the morning and at bedtime.   multivitamin tablet Take 1 tablet by mouth daily.   NON FORMULARY Take 1 capsule by mouth daily. pravagen taking 1 caps once a day   omeprazole 20 MG capsule Commonly known as: PRILOSEC TAKE 1 CAPSULE (20 MG TOTAL) BY MOUTH DAILY.   Paxlovid (300/100) 20 x 150 MG & 10 x 100MG  Tbpk Generic drug: nirmatrelvir & ritonavir Take by mouth.   predniSONE 20 MG tablet Commonly known as: DELTASONE Take 2 tablets (40 mg total) by mouth daily with breakfast for 5 days.        Dispo: The patient is from: Home              Anticipated d/c is to: Home  With History of - Reviewed by me  Past Medical History:  Diagnosis Date   Family hx of colon cancer    Hypertension    Hypertension    Memory loss       Past Surgical History:  Procedure Laterality Date  COLONOSCOPY N/A 03/29/2015   Procedure: COLONOSCOPY;  Surgeon: Rogene Houston, MD;  [facility];  Service: Endoscopy;  Laterality: N/A;  7:30   COLONOSCOPY WITH PROPOFOL N/A 05/05/2020   Procedure: COLONOSCOPY WITH PROPOFOL;  Surgeon: Rogene [place], MD;  Location: [facility];  Service: Endoscopy;  Laterality: N/A;  9:45   ESOPHAGOGASTRODUODENOSCOPY N/A 03/29/2015   Procedure: ESOPHAGOGASTRODUODENOSCOPY (EGD);  Surgeon: Rogene Houston, MD;  [facility];  Service: Endoscopy;  Laterality: N/A;   Chief Complaint  Patient presents with   Weakness     HPI:    Russell Reed  is a 76 y.o. male with past medical history relevant for HTN and underlying dementia presents to the ED with concerns about generalized weakness and poor appetite. -Patient was diagnosed with COVID-19 infection on 11/11/2021 -Patient's wife also has COVID infection she was diagnosed on 11/09/2021-- -patient was started on Paxlovid as outpatient on 11/11/2021 -Patient and his  wife are both vaccinated and both did -In the ED CBC WNL -Chest x-ray and CT chest consistent with Covid PNA without PE -CMP remarkable for AST of 53,  creatinine is 1.1 the BUN is up to 29, glucose is 142 - Additional history obtained from patient's sister-in-law Ms Dominic Pea who is a healthcare provider and patient's wife -Patient is without fevers or hypoxia    Review of systems:    In addition to the HPI above,   A full Review of  Systems was done, all other systems reviewed are negative except as noted above in HPI , .    Social History:  Reviewed by me   Social History   Tobacco Use   Smoking status: Never   Smokeless tobacco: Never  Substance Use Topics   Alcohol use: No     Family History :  Reviewed by me    Family History  Problem Relation Age of Onset   Colon cancer Sister     Home Medications:   Prior to Admission medications   Medication Sig Start Date End Date Taking? Authorizing Provider  acetaminophen (TYLENOL) 325 MG tablet Take 2 tablets (650 mg total) by mouth every 4 (four) hours as needed. 11/14/21 11/14/22 Yes Aleana Fifita, MD  amLODipine (NORVASC) 2.5 MG tablet Take 1 tablet (2.5 mg total) by mouth daily. Hold if SBP < 120 mmhg 11/21/21 11/21/22 Yes Viviano Bir, MD  aspirin 81 MG tablet Take 81 mg by mouth daily.   Yes [provider]  Dorzolamide HCl-Timolol Mal PF 2-0.5 % SOLN Place 1 drop into both eyes in the morning and at bedtime.   Yes [provider]  Multiple Vitamin (MULTIVITAMIN) tablet Take 1 tablet by mouth daily.   Yes [provider]  NON FORMULARY Take 1 capsule by mouth daily. pravagen taking 1 caps once a day   Yes [provider]  omeprazole (PRILOSEC) 20 MG capsule TAKE 1 CAPSULE (20 MG TOTAL) BY MOUTH DAILY. 08/31/21  Yes Harvel Quale, MD  PAXLOVID, 300/100, 20 x 150 MG & 10 x 100MG  TBPK Take by mouth. 11/11/21  Yes [provider]  predniSONE (DELTASONE) 20 MG  tablet Take 2 tablets (40 mg total) by mouth daily with breakfast for 5 days. 11/14/21 11/19/21 Yes Roxan Hockey, MD     Allergies:    No Known Allergies   Physical Exam:   Vitals  Blood pressure 98/72, pulse 69, temperature 98.2 F (36.8 C), temperature source Oral, resp. rate 18, height 5\' 11"  (  1.803 m), weight 71.7 kg, SpO2 95 %.  Physical Examination: General appearance - alert,  in no distress, no conversational dyspnea Mental status - alert, oriented to person, baseline cognitive and memory deficits , cooperative here in the ED eyes - sclera anicteric Neck - supple, no JVD elevation , Chest -actually despite imaging findings lung exam is pretty clear overall and air movement is fair and symmetrical  heart - S1 and S2 normal, regular  Abdomen - soft, nontender, nondistended, +BS Neurological - neck supple without rigidity, cranial nerves II through XII intact, DTR's normal and symmetric Extremities - no pedal edema noted, intact peripheral pulses  Skin - warm, dry    Data Review:    CBC Recent Labs  Lab 11/14/21 1410  WBC 6.7  HGB 15.0  HCT 43.3  PLT 193  MCV 86.8  MCH 30.1  MCHC 34.6  RDW 12.8  LYMPHSABS 1.2  MONOABS 0.5  EOSABS 0.0  BASOSABS 0.0   ------------------------------------------------------------------------------------------------------------------  Chemistries  Recent Labs  Lab 11/14/21 1410  NA 138  K 3.9  CL 99  CO2 30  GLUCOSE 142*  BUN 29*  CREATININE 1.10  CALCIUM 9.0  AST 53*  ALT 30  ALKPHOS 54  BILITOT 1.0   ------------------------------------------------------------------------------------------------------------------ estimated creatinine clearance is 59.8 mL/min (by C-G formula based on SCr of 1.1 mg/dL). ------------------------------------------------------------------------------------------------------------------ No results for input(s): "TSH", "T4TOTAL", "T3FREE", "THYROIDAB" in the last 72 hours.  Invalid  input(s): "FREET3"   Coagulation profile No results for input(s): "INR", "PROTIME" in the last 168 hours. ------------------------------------------------------------------------------------------------------------------- Recent Labs    11/14/21 1410  DDIMER 1.31*   -------------------------------------------------------------------------------------------------------------------  Cardiac Enzymes No results for input(s): "CKMB", "TROPONINI", "MYOGLOBIN" in the last 168 hours.  Invalid input(s): "CK" ------------------------------------------------------------------------------------------------------------------ No results found for: "BNP"   ---------------------------------------------------------------------------------------------------------------  Urinalysis No results found for: "COLORURINE", "APPEARANCEUR", "LABSPEC", "PHURINE", "GLUCOSEU", "HGBUR", "BILIRUBINUR", "KETONESUR", "PROTEINUR", "UROBILINOGEN", "NITRITE", "LEUKOCYTESUR"  ----------------------------------------------------------------------------------------------------------------   Imaging Results:    CT Angio Chest PE W/Cm &/Or Wo Cm  Result Date: 11/14/2021 CLINICAL DATA:  Pulmonary embolus suspected. EXAM: CT ANGIOGRAPHY CHEST WITH CONTRAST TECHNIQUE: Multidetector CT imaging of the chest was performed using the standard protocol during bolus administration of intravenous contrast. Multiplanar CT image reconstructions and MIPs were obtained to evaluate the vascular anatomy. RADIATION DOSE REDUCTION: This exam was performed according to the departmental dose-optimization program which includes automated exposure control, adjustment of the mA and/or kV according to patient size and/or use of iterative reconstruction technique. CONTRAST:  47mL OMNIPAQUE IOHEXOL 350 MG/ML SOLN COMPARISON:  None Available. FINDINGS: Cardiovascular: No evidence of pulmonary embolus, although evaluation of the segmental and  subsegmental pulmonary arteries in the lower lungs is limited due to respiratory motion artifact. Normal heart size. No pericardial effusion. Normal caliber thoracic aorta with atherosclerotic disease. Mediastinum/Nodes: Thyroid is unremarkable. Esophagus is unremarkable. No pathologically enlarged lymph nodes seen in the chest. Lungs/Pleura: Central airways are patent. Patchy ground-glass opacities and ground-glass nodules are seen throughout the right lung. No pleural effusion or pneumothorax. Upper Abdomen: No acute abnormality. Musculoskeletal: No chest wall abnormality. No acute or significant osseous findings. Review of the MIP images confirms the above findings. IMPRESSION: 1. No evidence of pulmonary embolus, although evaluation of the segmental and subsegmental pulmonary arteries in the lower lungs is limited due to respiratory motion artifact. 2. Patchy ground-glass opacities and ground-glass nodules are seen throughout the right lung, likely infectious or inflammatory. Recommend follow-up chest CT in 3 months to ensure resolution. 3.  Aortic Atherosclerosis (ICD10-I70.0). Electronically  Signed   By: Yetta Glassman M.D.   On: 11/14/2021 16:02   DG Chest Portable 1 View  Result Date: 11/14/2021 CLINICAL DATA:  Cough.  Diagnosed with COVID 3 days ago. EXAM: PORTABLE CHEST 1 VIEW COMPARISON:  Radiographs 07/06/2014. FINDINGS: 1238 hours. The heart size and mediastinal contours are stable. There is mild patchy airspace disease at the right lung base associated with a small right pleural effusion. The left lung appears clear. There is no pneumothorax. No acute osseous findings. Stable asymmetric glenohumeral degenerative changes on the right and mild thoracic spondylosis. IMPRESSION: New patchy right basilar airspace disease and small right pleural effusion suspicious for early pneumonia. Electronically Signed   By: Richardean Sale M.D.   On: 11/14/2021 12:48    Radiological Exams on Admission: CT  Angio Chest PE W/Cm &/Or Wo Cm  Result Date: 11/14/2021 CLINICAL DATA:  Pulmonary embolus suspected. EXAM: CT ANGIOGRAPHY CHEST WITH CONTRAST TECHNIQUE: Multidetector CT imaging of the chest was performed using the standard protocol during bolus administration of intravenous contrast. Multiplanar CT image reconstructions and MIPs were obtained to evaluate the vascular anatomy. RADIATION DOSE REDUCTION: This exam was performed according to the departmental dose-optimization program which includes automated exposure control, adjustment of the mA and/or kV according to patient size and/or use of iterative reconstruction technique. CONTRAST:  53mL OMNIPAQUE IOHEXOL 350 MG/ML SOLN COMPARISON:  None Available. FINDINGS: Cardiovascular: No evidence of pulmonary embolus, although evaluation of the segmental and subsegmental pulmonary arteries in the lower lungs is limited due to respiratory motion artifact. Normal heart size. No pericardial effusion. Normal caliber thoracic aorta with atherosclerotic disease. Mediastinum/Nodes: Thyroid is unremarkable. Esophagus is unremarkable. No pathologically enlarged lymph nodes seen in the chest. Lungs/Pleura: Central airways are patent. Patchy ground-glass opacities and ground-glass nodules are seen throughout the right lung. No pleural effusion or pneumothorax. Upper Abdomen: No acute abnormality. Musculoskeletal: No chest wall abnormality. No acute or significant osseous findings. Review of the MIP images confirms the above findings. IMPRESSION: 1. No evidence of pulmonary embolus, although evaluation of the segmental and subsegmental pulmonary arteries in the lower lungs is limited due to respiratory motion artifact. 2. Patchy ground-glass opacities and ground-glass nodules are seen throughout the right lung, likely infectious or inflammatory. Recommend follow-up chest CT in 3 months to ensure resolution. 3.  Aortic Atherosclerosis (ICD10-I70.0). Electronically Signed   By: Yetta Glassman M.D.   On: 11/14/2021 16:02   DG Chest Portable 1 View  Result Date: 11/14/2021 CLINICAL DATA:  Cough.  Diagnosed with COVID 3 days ago. EXAM: PORTABLE CHEST 1 VIEW COMPARISON:  Radiographs 07/06/2014. FINDINGS: 1238 hours. The heart size and mediastinal contours are stable. There is mild patchy airspace disease at the right lung base associated with a small right pleural effusion. The left lung appears clear. There is no pneumothorax. No acute osseous findings. Stable asymmetric glenohumeral degenerative changes on the right and mild thoracic spondylosis. IMPRESSION: New patchy right basilar airspace disease and small right pleural effusion suspicious for early pneumonia. Electronically Signed   By: Richardean Sale M.D.   On: 11/14/2021 12:48    Family Communication:  patients condition and plan of care including tests being ordered have been discussed with the patient and wife and sister-in-law who indicate understanding and agree with the plan   Condition  -- stable without hypoxia  Roxan Hockey M.D on 11/14/2021 at 8:05 PM Go to [email or web address] -  for contact info  [facility] - Office  [phone]

## 2021-12-12 DIAGNOSIS — Z0000 Encounter for general adult medical examination without abnormal findings: Secondary | ICD-10-CM | POA: Diagnosis not present

## 2021-12-12 DIAGNOSIS — N390 Urinary tract infection, site not specified: Secondary | ICD-10-CM | POA: Diagnosis not present

## 2021-12-12 DIAGNOSIS — I10 Essential (primary) hypertension: Secondary | ICD-10-CM | POA: Diagnosis not present

## 2021-12-12 DIAGNOSIS — F03A0 Unspecified dementia, mild, without behavioral disturbance, psychotic disturbance, mood disturbance, and anxiety: Secondary | ICD-10-CM | POA: Diagnosis not present

## 2022-02-20 DIAGNOSIS — H11043 Peripheral pterygium, stationary, bilateral: Secondary | ICD-10-CM | POA: Diagnosis not present

## 2022-02-20 DIAGNOSIS — H25811 Combined forms of age-related cataract, right eye: Secondary | ICD-10-CM | POA: Diagnosis not present

## 2022-02-20 DIAGNOSIS — H40021 Open angle with borderline findings, high risk, right eye: Secondary | ICD-10-CM | POA: Diagnosis not present

## 2022-02-20 DIAGNOSIS — H401123 Primary open-angle glaucoma, left eye, severe stage: Secondary | ICD-10-CM | POA: Diagnosis not present

## 2022-03-04 ENCOUNTER — Encounter (INDEPENDENT_AMBULATORY_CARE_PROVIDER_SITE_OTHER): Payer: Self-pay | Admitting: Gastroenterology

## 2022-04-18 IMAGING — DX DG SHOULDER 2+V*R*
3 series · 3 of 3 positions shown · non-contrast
Comparison: None.

CLINICAL DATA: Chronic right shoulder pain.

EXAM:
RIGHT SHOULDER - 2+ VIEW

[shoulder grashey]
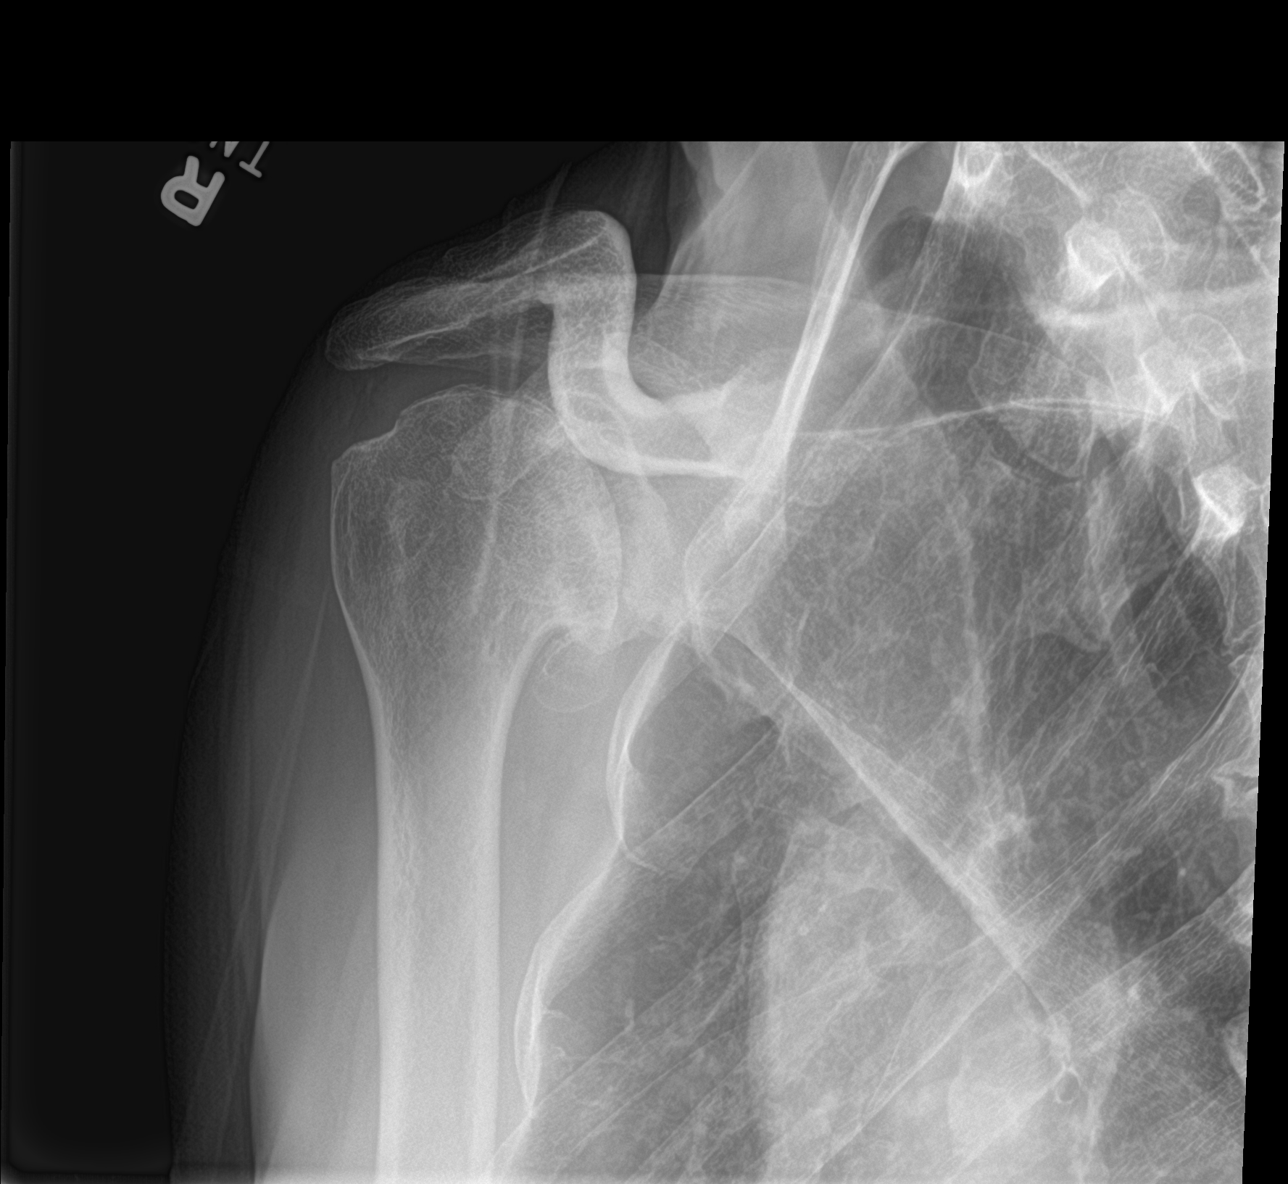

[shoulder y view]
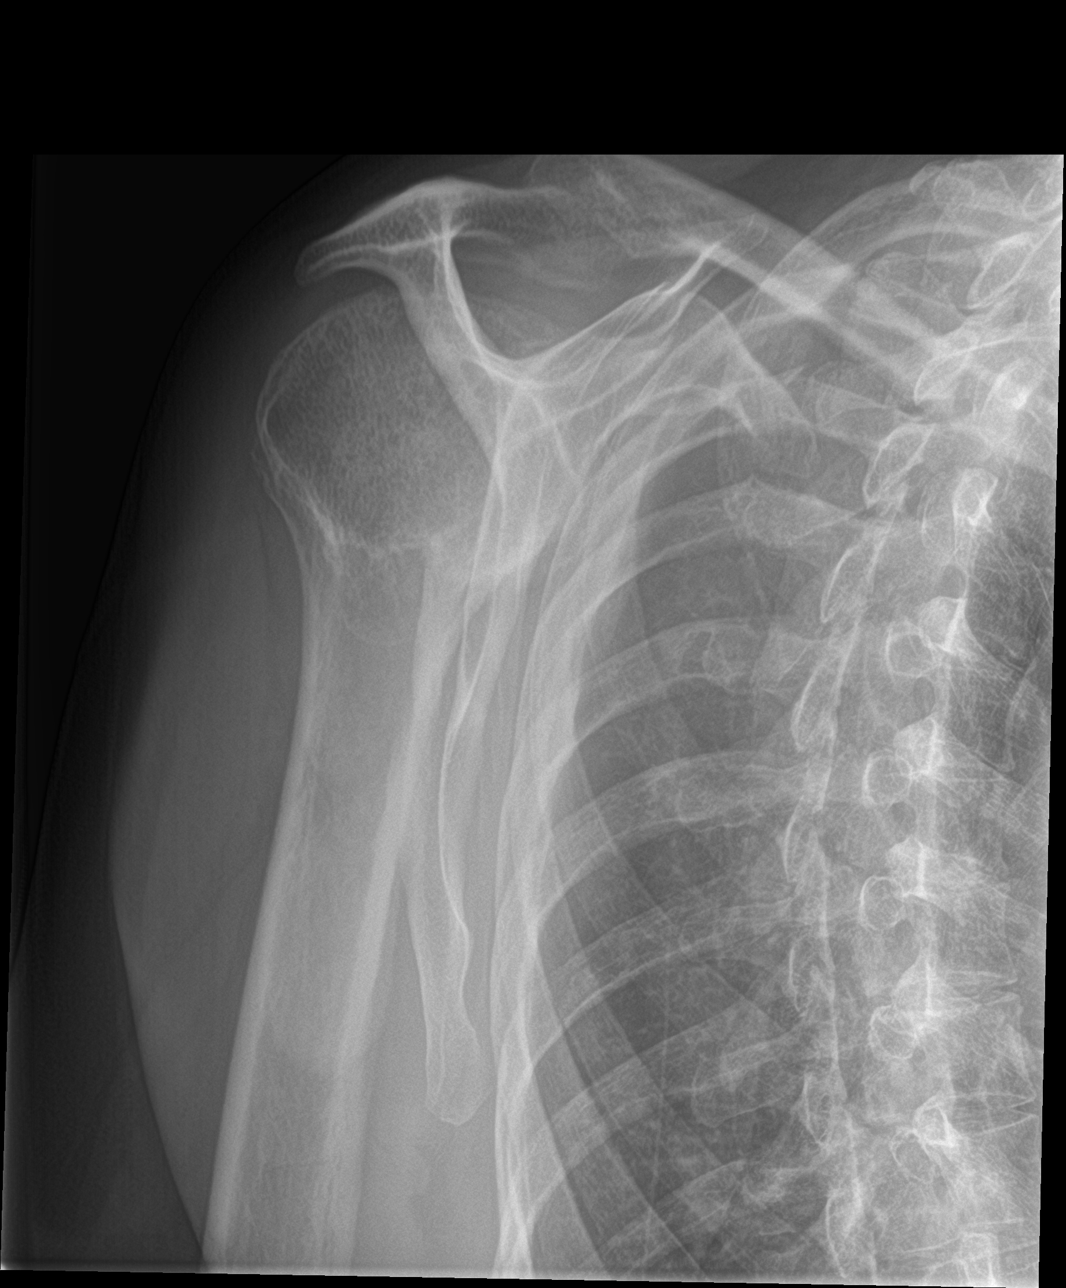

[shoulder axillary]
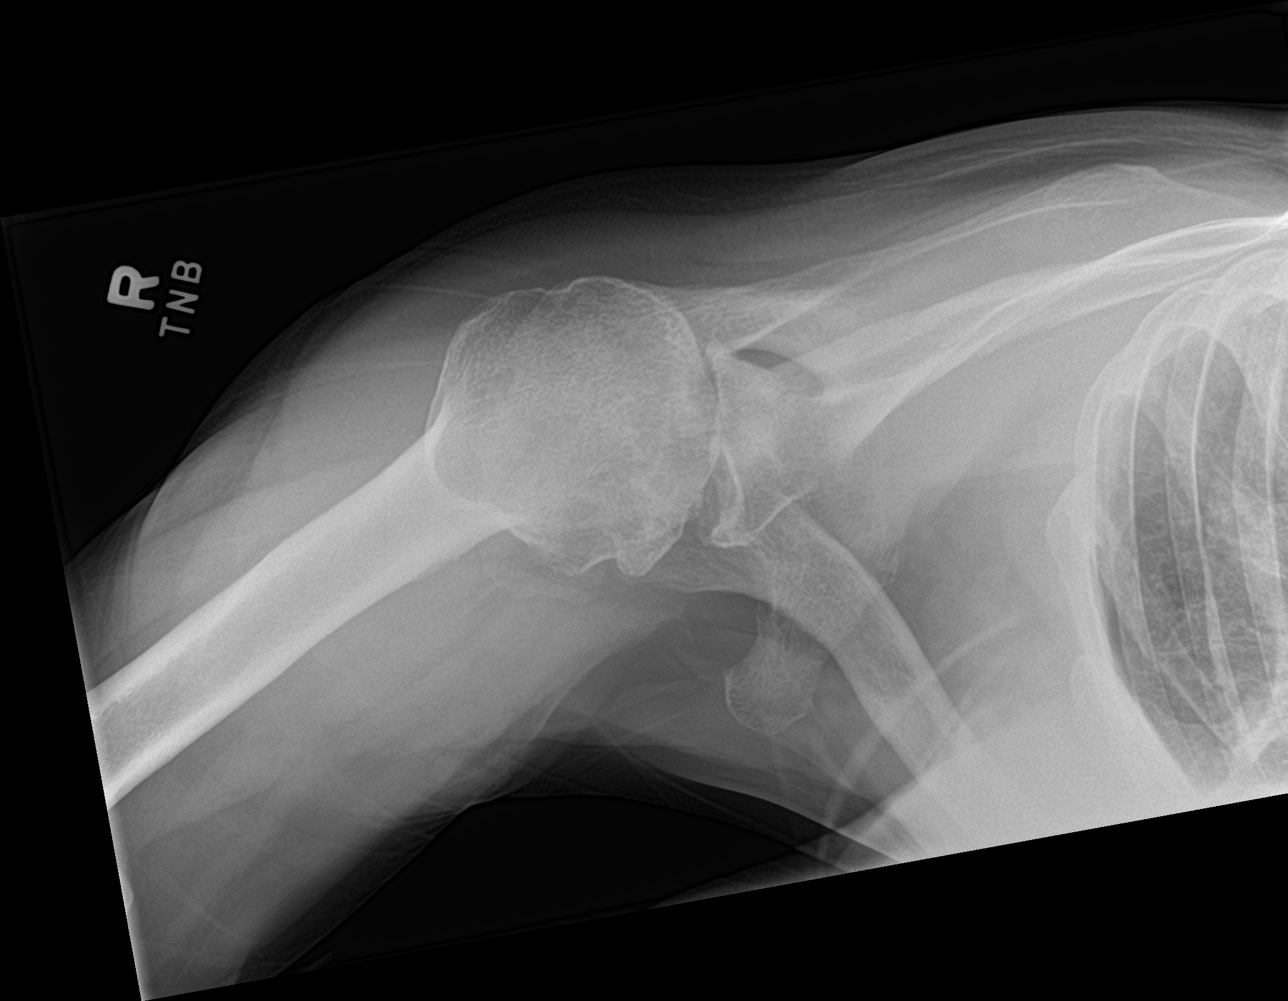

[3 of 3 positions shown; findings below may reference images not displayed]

FINDINGS: There is no evidence of fracture or dislocation. There is severe
glenohumeral joint space narrowing with subchondral cyst formation.
IMPRESSION: Severe glenohumeral osteoarthritis.

## 2022-06-17 DIAGNOSIS — I10 Essential (primary) hypertension: Secondary | ICD-10-CM | POA: Diagnosis not present

## 2022-06-17 DIAGNOSIS — E7800 Pure hypercholesterolemia, unspecified: Secondary | ICD-10-CM | POA: Diagnosis not present

## 2022-06-17 DIAGNOSIS — F03A0 Unspecified dementia, mild, without behavioral disturbance, psychotic disturbance, mood disturbance, and anxiety: Secondary | ICD-10-CM | POA: Diagnosis not present

## 2022-06-23 DIAGNOSIS — I10 Essential (primary) hypertension: Secondary | ICD-10-CM | POA: Diagnosis not present

## 2022-06-23 DIAGNOSIS — E7800 Pure hypercholesterolemia, unspecified: Secondary | ICD-10-CM | POA: Diagnosis not present

## 2022-08-28 DIAGNOSIS — H40021 Open angle with borderline findings, high risk, right eye: Secondary | ICD-10-CM | POA: Diagnosis not present

## 2022-08-28 DIAGNOSIS — H401123 Primary open-angle glaucoma, left eye, severe stage: Secondary | ICD-10-CM | POA: Diagnosis not present

## 2022-08-28 DIAGNOSIS — H11043 Peripheral pterygium, stationary, bilateral: Secondary | ICD-10-CM | POA: Diagnosis not present

## 2022-08-28 DIAGNOSIS — H25811 Combined forms of age-related cataract, right eye: Secondary | ICD-10-CM | POA: Diagnosis not present

## 2022-09-20 DIAGNOSIS — Z809 Family history of malignant neoplasm, unspecified: Secondary | ICD-10-CM | POA: Diagnosis not present

## 2022-09-20 DIAGNOSIS — I129 Hypertensive chronic kidney disease with stage 1 through stage 4 chronic kidney disease, or unspecified chronic kidney disease: Secondary | ICD-10-CM | POA: Diagnosis not present

## 2022-09-20 DIAGNOSIS — I700 Atherosclerosis of aorta: Secondary | ICD-10-CM | POA: Diagnosis not present

## 2022-09-20 DIAGNOSIS — N182 Chronic kidney disease, stage 2 (mild): Secondary | ICD-10-CM | POA: Diagnosis not present

## 2022-09-20 DIAGNOSIS — H409 Unspecified glaucoma: Secondary | ICD-10-CM | POA: Diagnosis not present

## 2022-09-20 DIAGNOSIS — K219 Gastro-esophageal reflux disease without esophagitis: Secondary | ICD-10-CM | POA: Diagnosis not present

## 2022-09-20 DIAGNOSIS — H3530 Unspecified macular degeneration: Secondary | ICD-10-CM | POA: Diagnosis not present

## 2022-09-20 DIAGNOSIS — E785 Hyperlipidemia, unspecified: Secondary | ICD-10-CM | POA: Diagnosis not present

## 2022-09-20 DIAGNOSIS — Z5982 Transportation insecurity: Secondary | ICD-10-CM | POA: Diagnosis not present

## 2022-10-21 DIAGNOSIS — Z125 Encounter for screening for malignant neoplasm of prostate: Secondary | ICD-10-CM | POA: Diagnosis not present

## 2022-10-21 DIAGNOSIS — N400 Enlarged prostate without lower urinary tract symptoms: Secondary | ICD-10-CM | POA: Diagnosis not present

## 2022-10-27 DIAGNOSIS — Z125 Encounter for screening for malignant neoplasm of prostate: Secondary | ICD-10-CM | POA: Diagnosis not present

## 2022-11-02 ENCOUNTER — Other Ambulatory Visit (INDEPENDENT_AMBULATORY_CARE_PROVIDER_SITE_OTHER): Payer: Self-pay | Admitting: Gastroenterology

## 2022-11-02 DIAGNOSIS — K2100 Gastro-esophageal reflux disease with esophagitis, without bleeding: Secondary | ICD-10-CM

## 2022-11-06 ENCOUNTER — Encounter (INDEPENDENT_AMBULATORY_CARE_PROVIDER_SITE_OTHER): Payer: Self-pay | Admitting: Gastroenterology

## 2022-11-06 ENCOUNTER — Ambulatory Visit (INDEPENDENT_AMBULATORY_CARE_PROVIDER_SITE_OTHER): Payer: Medicare PPO | Admitting: Gastroenterology

## 2022-11-06 VITALS — BP 134/85 | HR 76 | Temp 97.5°F | Ht 71.0 in | Wt 149.3 lb

## 2022-11-06 DIAGNOSIS — K2100 Gastro-esophageal reflux disease with esophagitis, without bleeding: Secondary | ICD-10-CM | POA: Diagnosis not present

## 2022-11-06 MED ORDER — OMEPRAZOLE 20 MG PO CPDR: 20.0000 mg | DELAYED_RELEASE_CAPSULE | Freq: Every day | ORAL | 3 refills | Status: DC

## 2022-11-06 NOTE — Patient Instructions (Addendum)
Continue with omeprazole 20mg  daily Be mindful of greasy, spicy, fried, citrus foods, caffeine, carbonated drinks, and chocolate as these can increase reflux symptoms Stay upright 2-3 hours after eating, prior to lying down and avoid eating late in the evenings.   Follow up 1 year   It was a pleasure to see you today. I want to create trusting relationships with patients and provide genuine, compassionate, and quality care. I truly value your feedback! please be on the lookout for a survey regarding your visit with me today. I appreciate your input about our visit and your time in completing this!    Keajah Killough L. Jeanmarie Hubert, MSN, APRN, AGNP-C Adult-Gerontology Nurse Practitioner [facility] at [facility]

## 2022-11-06 NOTE — Progress Notes (Signed)
Referring Provider: Mirna Mires, MD Primary Care Physician:  Mirna Mires, MD Primary GI Physician: Levon Hedger   Chief Complaint  Patient presents with   Gastroesophageal Reflux    Patient here today for a follow up on Gerd. Patient doing well with the omeprazole 20 mg once per day. He is needing refills on this.   HPI:   Russell Reed is a 77 y.o. male with past medical history of hypertension, dementia, history of colon cancer, and GERD   Patient presenting today for follow up of GERD  Last seen July 2022, at that time, doing well. Taking PPI compliantly without issues.   Recommended to decrease omeprazole to 20mg  daily.  Present:  Doing well. Patient's wife helps provide history. Patient Denies GERD symptoms. No dysphagia or odynophagia. No abdominal pain, no nausea or vomiting.  Having a BM usually daily.  Wife states he likes to sleep late so usually misses breakfast. He has had some mild weight loss over the past year (9 lbs) but she feels overall his appetite is good when he does eat. No rectal bleeding or melena. They report no GI complaints today.   Last Colonoscopy:04/2020 normal, external hemorrhoids   Recommendations:  Repeat colonoscopy in 2027  Past Medical History:  Diagnosis Date   Family hx of colon cancer    Hypertension    Hypertension    Memory loss     Past Surgical History:  Procedure Laterality Date   COLONOSCOPY N/A 03/29/2015   Procedure: COLONOSCOPY;  Surgeon: Malissa Hippo, MD;  [facility];  Service: Endoscopy;  Laterality: N/A;  7:30   COLONOSCOPY WITH PROPOFOL N/A 05/05/2020   Procedure: COLONOSCOPY WITH PROPOFOL;  Surgeon: Malissa Hippo, MD;  [facility];  Service: Endoscopy;  Laterality: N/A;  9:45   ESOPHAGOGASTRODUODENOSCOPY N/A 03/29/2015   Procedure: ESOPHAGOGASTRODUODENOSCOPY (EGD);  Surgeon: Malissa Hippo, MD;  [facility];  Service: Endoscopy;  Laterality: N/A;    Current Outpatient  Medications  Medication Sig Dispense Refill   acetaminophen (TYLENOL) 325 MG tablet Take 2 tablets (650 mg total) by mouth every 4 (four) hours as needed. 100 tablet 2   amLODipine (NORVASC) 5 MG tablet Take 5 mg by mouth daily.     Apoaequorin (PREVAGEN PO) Take by mouth daily at 6 (six) AM.     aspirin 81 MG tablet Take 81 mg by mouth daily.     Dorzolamide HCl-Timolol Mal PF 2-0.5 % SOLN Place 1 drop into both eyes in the morning and at bedtime.     Multiple Vitamin (MULTIVITAMIN) tablet Take 1 tablet by mouth daily.     omeprazole (PRILOSEC) 20 MG capsule TAKE 1 CAPSULE (20 MG TOTAL) BY MOUTH DAILY. 90 capsule 3   No current facility-administered medications for this visit.    Allergies as of 11/06/2022   (Not on File)    Family History  Problem Relation Age of Onset   Colon cancer Sister     Social History   Socioeconomic History   Marital status: Married    Spouse name: Not on file   Number of children: Not on file   Years of education: Not on file   Highest education level: Not on file  Occupational History   Not on file  Tobacco Use   Smoking status: Never   Smokeless tobacco: Never  Vaping Use   Vaping status: Never Used  Substance and Sexual Activity   Alcohol use: No   Drug  use: No   Sexual activity: Not on file  Other Topics Concern   Not on file  Social History Narrative   Not on file   Social Determinants of Health   Financial Resource Strain: Not on file  Food Insecurity: Not on file  Transportation Needs: Not on file  Physical Activity: Not on file  Stress: Not on file  Social Connections: Not on file   Review of systems General: negative for malaise, night sweats, fever, chills, +mild weight loss Neck: Negative for lumps, goiter, pain and significant neck swelling Resp: Negative for cough, wheezing, dyspnea at rest CV: Negative for chest pain, leg swelling, palpitations, orthopnea GI: denies melena, hematochezia, nausea, vomiting, diarrhea,  constipation, dysphagia, odyonophagia, early satiety +mild/gradual weight loss  MSK: Negative for joint pain or swelling, back pain, and muscle pain. Psych: Denies depression, anxiety, confusion. No homicidal or suicidal ideation.  Neuro: negative for tremor, gait imbalance, syncope and seizures. The remainder of the review of systems is noncontributory.  Physical Exam: BP 134/85 (BP Location: Left Arm, Patient Position: Sitting, Cuff Size: Normal)   Pulse 76   Temp (!) 97.5 F (36.4 C) (Temporal)   Ht 5\' 11"  (1.803 m)   Wt 149 lb 4.8 oz (67.7 kg)   BMI 20.82 kg/m  General:   Alert and oriented. No distress noted. Pleasant and cooperative.  Head:  Normocephalic and atraumatic. Eyes:  Conjuctiva clear without scleral icterus. Mouth:  Oral mucosa pink and moist. Good dentition. No lesions. Heart: Normal rate and rhythm, s1 and s2 heart sounds present.  Lungs: Clear lung sounds in all lobes. Respirations equal and unlabored. Abdomen:  +BS, soft, non-tender and non-distended. No rebound or guarding. No HSM or masses noted. Derm: No palmar erythema or jaundice Msk:  Symmetrical without gross deformities. Normal posture. Extremities:  Without edema. Neurologic:  Alert   Psych:  Alert and cooperative. Normal mood and affect.  Invalid input(s): "6 MONTHS"   ASSESSMENT: Russell Reed is a 77 y.o. male presenting today for follow up of GERD  GERD: PPI dose decreased from omeprazole 40 mg to omeprazole 20 mg daily.  Patient denies any GERD symptoms, dysphagia, odynophagia.  Denies abdominal pain, nausea, vomiting.  Wife states his appetite is pretty good.  Will continue with current PPI regimen, good reflux precautions.   PLAN:  Continue with omeprazole 20mg  daily, refill sent  2. Good Reflux precautions   All questions were answered, patient verbalized understanding and is in agreement with plan as outlined above.   Follow Up: 1 year   Felecity Lemaster L. Jeanmarie Hubert, MSN, APRN,  AGNP-C Adult-Gerontology Nurse Practitioner [facility]  I have reviewed the note and agree with the APP's assessment as described in this progress note  [name], MD Gastroenterology and Hepatology [facility]

## 2023-02-24 DIAGNOSIS — I10 Essential (primary) hypertension: Secondary | ICD-10-CM | POA: Diagnosis not present

## 2023-02-24 DIAGNOSIS — F03A0 Unspecified dementia, mild, without behavioral disturbance, psychotic disturbance, mood disturbance, and anxiety: Secondary | ICD-10-CM | POA: Diagnosis not present

## 2023-02-24 DIAGNOSIS — E7800 Pure hypercholesterolemia, unspecified: Secondary | ICD-10-CM | POA: Diagnosis not present

## 2023-02-26 DIAGNOSIS — H401123 Primary open-angle glaucoma, left eye, severe stage: Secondary | ICD-10-CM | POA: Diagnosis not present

## 2023-03-02 DIAGNOSIS — I10 Essential (primary) hypertension: Secondary | ICD-10-CM | POA: Diagnosis not present

## 2023-03-02 DIAGNOSIS — E7800 Pure hypercholesterolemia, unspecified: Secondary | ICD-10-CM | POA: Diagnosis not present

## 2023-04-03 DIAGNOSIS — H401123 Primary open-angle glaucoma, left eye, severe stage: Secondary | ICD-10-CM | POA: Diagnosis not present

## 2023-07-07 DIAGNOSIS — I10 Essential (primary) hypertension: Secondary | ICD-10-CM | POA: Diagnosis not present

## 2023-07-07 DIAGNOSIS — E7800 Pure hypercholesterolemia, unspecified: Secondary | ICD-10-CM | POA: Diagnosis not present

## 2023-07-07 DIAGNOSIS — F03A0 Unspecified dementia, mild, without behavioral disturbance, psychotic disturbance, mood disturbance, and anxiety: Secondary | ICD-10-CM | POA: Diagnosis not present

## 2023-10-12 ENCOUNTER — Other Ambulatory Visit (INDEPENDENT_AMBULATORY_CARE_PROVIDER_SITE_OTHER): Payer: Self-pay | Admitting: Gastroenterology

## 2023-10-12 DIAGNOSIS — K2100 Gastro-esophageal reflux disease with esophagitis, without bleeding: Secondary | ICD-10-CM

## 2023-10-24 DIAGNOSIS — H401123 Primary open-angle glaucoma, left eye, severe stage: Secondary | ICD-10-CM | POA: Diagnosis not present

## 2023-10-24 DIAGNOSIS — H11043 Peripheral pterygium, stationary, bilateral: Secondary | ICD-10-CM | POA: Diagnosis not present

## 2023-10-24 DIAGNOSIS — H40021 Open angle with borderline findings, high risk, right eye: Secondary | ICD-10-CM | POA: Diagnosis not present

## 2023-10-24 DIAGNOSIS — H25811 Combined forms of age-related cataract, right eye: Secondary | ICD-10-CM | POA: Diagnosis not present

## 2023-11-08 ENCOUNTER — Ambulatory Visit (INDEPENDENT_AMBULATORY_CARE_PROVIDER_SITE_OTHER): Payer: Medicare PPO | Admitting: Gastroenterology

## 2023-11-08 ENCOUNTER — Encounter (INDEPENDENT_AMBULATORY_CARE_PROVIDER_SITE_OTHER): Payer: Self-pay | Admitting: Gastroenterology

## 2023-11-08 VITALS — BP 126/90 | HR 84 | Temp 98.1°F | Ht 71.0 in | Wt 150.9 lb

## 2023-11-08 DIAGNOSIS — K219 Gastro-esophageal reflux disease without esophagitis: Secondary | ICD-10-CM | POA: Diagnosis not present

## 2023-11-08 DIAGNOSIS — K2100 Gastro-esophageal reflux disease with esophagitis, without bleeding: Secondary | ICD-10-CM

## 2023-11-08 DIAGNOSIS — K5900 Constipation, unspecified: Secondary | ICD-10-CM | POA: Diagnosis not present

## 2023-11-08 DIAGNOSIS — R194 Change in bowel habit: Secondary | ICD-10-CM | POA: Insufficient documentation

## 2023-11-08 MED ORDER — OMEPRAZOLE 20 MG PO CPDR: 20.0000 mg | DELAYED_RELEASE_CAPSULE | Freq: Every day | ORAL | 3 refills | Status: AC

## 2023-11-08 NOTE — Progress Notes (Addendum)
 Referring Provider: Leigh Lung, MD Primary Care Physician:  Leigh Lung, MD Primary GI Physician: Dr. Eartha   Chief Complaint  Patient presents with   Follow-up    Pt arrives for follow up. No issues/concerns at this time. GERD doing well. Will need further refills on Omeprazole     HPI:   Russell Reed is a 78 y.o. male with past medical history of hypertension, dementia, history of colon cancer, and GERD   Patient presenting today for:  Follow up of GERD and decreased bowel movement frequency   Last seen July 2024, at that time doing well on Omeprazole  20mg  daily, mild weight loss over the past year but appetite good per patient's wife.   Present: Wife helps provide history due to patient's history of dementia. States he eats a little less than before though weight is stable. She notes he sleeps late and eats a late breakfast then will eat dinner. Patient denies any abdominal pain or GERD symptoms, wife has not noticed anything symptoms in the patient. She notices less frequent BMs, maybe once per week but denies patient having to sit for long time or complaining of any discomfort, no rectal bleeding or melena. She does note water  intake is not as good as it should be though she does encourage him to drink water  often.   Last Colonoscopy:04/2020 normal, external hemorrhoids    Recommendations:  Repeat colonoscopy in 2027  Filed Weights   11/08/23 1007  Weight: 150 lb 14.4 oz (68.4 kg)     Past Medical History:  Diagnosis Date   Family hx of colon cancer    Hypertension    Hypertension    Memory loss     Past Surgical History:  Procedure Laterality Date   COLONOSCOPY N/A 03/29/2015   Procedure: COLONOSCOPY;  Surgeon: Claudis RAYMOND Rivet, MD;  Location: [facility];  Service: Endoscopy;  Laterality: N/A;  7:30   COLONOSCOPY WITH PROPOFOL  N/A 05/05/2020   Procedure: COLONOSCOPY WITH PROPOFOL ;  Surgeon: Rivet Claudis RAYMOND, MD;  Location: [facility];  Service:  Endoscopy;  Laterality: N/A;  9:45   ESOPHAGOGASTRODUODENOSCOPY N/A 03/29/2015   Procedure: ESOPHAGOGASTRODUODENOSCOPY (EGD);  Surgeon: Claudis RAYMOND Rivet, MD;  Location: [facility];  Service: Endoscopy;  Laterality: N/A;    Current Outpatient Medications  Medication Sig Dispense Refill   amLODipine  (NORVASC ) 5 MG tablet Take 5 mg by mouth daily. (Patient taking differently: Take 2.5 mg by mouth daily.)     Apoaequorin (PREVAGEN PO) Take by mouth daily at 6 (six) AM.     aspirin 81 MG tablet Take 81 mg by mouth daily.     Dorzolamide HCl-Timolol Mal PF 2-0.5 % SOLN Place 1 drop into both eyes in the morning and at bedtime.     Multiple Vitamin (MULTIVITAMIN) tablet Take 1 tablet by mouth daily.     omeprazole  (PRILOSEC) 20 MG capsule TAKE 1 CAPSULE EVERY DAY 90 capsule 0   No current facility-administered medications for this visit.    Allergies as of 11/08/2023   (Not on File)    Social History   Socioeconomic History   Marital status: Married    Spouse name: Not on file   Number of children: Not on file   Years of education: Not on file   Highest education level: Not on file  Occupational History   Not on file  Tobacco Use   Smoking status: Never   Smokeless tobacco: Never  Vaping Use   Vaping status: Never  Used  Substance and Sexual Activity   Alcohol use: No   Drug use: No   Sexual activity: Not on file  Other Topics Concern   Not on file  Social History Narrative   Not on file   Social Drivers of Health   Financial Resource Strain: Not on file  Food Insecurity: Not on file  Transportation Needs: Not on file  Physical Activity: Not on file  Stress: Not on file  Social Connections: Not on file    Review of systems General: negative for malaise, night sweats, fever, chills, weight loss Neck: Negative for lumps, goiter, pain and significant neck swelling Resp: Negative for cough, wheezing, dyspnea at rest CV: Negative for chest pain, leg swelling,  palpitations, orthopnea GI: denies melena, hematochezia, nausea, vomiting, diarrhea, dysphagia, odyonophagia, early satiety or unintentional weight loss.  +less frequent BMs  The remainder of the review of systems is noncontributory.  Physical Exam: BP (!) 126/90   Pulse 84   Temp 98.1 F (36.7 C)   Ht 5' 11 (1.803 m)   Wt 150 lb 14.4 oz (68.4 kg)   BMI 21.05 kg/m  General:   Alert. No distress noted. Pleasant and cooperative.  Head:  Normocephalic and atraumatic. Eyes:  Conjuctiva clear without scleral icterus. Mouth:  Oral mucosa pink and moist. Good dentition. No lesions. Heart: Normal rate and rhythm, s1 and s2 heart sounds present.  Lungs: Clear lung sounds in all lobes. Respirations equal and unlabored. Abdomen:  +BS, soft, non-tender and non-distended. No rebound or guarding. No HSM or masses noted. Derm: No palmar erythema or jaundice Msk:  Symmetrical without gross deformities. Normal posture. Extremities:  Without edema. Neurologic:  Alert and  disoriented  Psych:  Alert and cooperative. Normal mood and affect.  Invalid input(s): 6 MONTHS   ASSESSMENT: Russell Reed is a 78 y.o. male presenting today for follow up of GERD and with constipation  GERD: seems to be well controlled though subjectivity is difficult to obtain from patient due to history of dementia. His wife/caregiver has not noticed any changes, signs of him having issues with his reflux. She denies any reports from him at home of symptoms. We will continue PPI at low dose for now, as symptoms appear to be well controlled.   Decreased bowel movement frequency: wife reports maybe one BM per week though not having to sit for longer periods of time or strain. Patient has not complained of any abdominal pain, abdominal exam is benign today. No blood in stools or black stools per his wife. She notes water  intake is low, he prefers to drink sodas. She is encouraging him to try to drink more water  and I also  encouraged him during our visit today. Should also try to increase fruits, veggies, whole grains in his diet. If she notices more time on the toilet, straining or any discomfort with BMs, she will let me know as we may need to get him on a bowel regimen.    PLAN:  -continue omeprazole  20mg  daily -Increase water  intake, aim for atleast 64 oz per day -Increase fruits, veggies and whole grains, kiwi and prunes are especially good for constipation -pts wife to make me aware if patient straining, spending long times on the toilet as we will need to start bowel regimen  All questions were answered, patient verbalized understanding and is in agreement with plan as outlined above.    Follow Up: 1 year   Tyliyah Mcmeekin L. Jerzey Komperda, MSN, APRN, AGNP-C Adult-Gerontology  Nurse Practitioner [facility]   I have reviewed the note and agree with the APP's assessment as described in this progress note  Toribio Fortune, MD Gastroenterology and Hepatology [facility]

## 2023-11-08 NOTE — Patient Instructions (Signed)
 Continue with omeprazole  20mg  daily Increase water  intake, aim for atleast 64 oz per day Increase fruits, veggies and whole grains, kiwi and prunes are especially good for constipation If you are noticing more straining or long periods on the toilet, please let me know as we may need to start a bowel regimen to get bowels moving better  Follow up 1 year  It was a pleasure to see you today. I want to create trusting relationships with patients and provide genuine, compassionate, and quality care. I truly value your feedback! please be on the lookout for a survey regarding your visit with me today. I appreciate your input about our visit and your time in completing this!    Shakayla Hickox L. Juandiego Kolenovic, MSN, APRN, AGNP-C Adult-Gerontology Nurse Practitioner [facility] at [facility]

## 2023-11-10 DIAGNOSIS — I10 Essential (primary) hypertension: Secondary | ICD-10-CM | POA: Diagnosis not present

## 2023-11-10 DIAGNOSIS — F03A0 Unspecified dementia, mild, without behavioral disturbance, psychotic disturbance, mood disturbance, and anxiety: Secondary | ICD-10-CM | POA: Diagnosis not present

## 2023-11-10 DIAGNOSIS — E7800 Pure hypercholesterolemia, unspecified: Secondary | ICD-10-CM | POA: Diagnosis not present

## 2023-11-10 DIAGNOSIS — K2100 Gastro-esophageal reflux disease with esophagitis, without bleeding: Secondary | ICD-10-CM | POA: Diagnosis not present

## 2023-11-15 DIAGNOSIS — D6869 Other thrombophilia: Secondary | ICD-10-CM | POA: Diagnosis not present

## 2023-11-15 DIAGNOSIS — Z7982 Long term (current) use of aspirin: Secondary | ICD-10-CM | POA: Diagnosis not present

## 2023-11-15 DIAGNOSIS — I129 Hypertensive chronic kidney disease with stage 1 through stage 4 chronic kidney disease, or unspecified chronic kidney disease: Secondary | ICD-10-CM | POA: Diagnosis not present

## 2023-11-15 DIAGNOSIS — E785 Hyperlipidemia, unspecified: Secondary | ICD-10-CM | POA: Diagnosis not present

## 2023-11-15 DIAGNOSIS — K219 Gastro-esophageal reflux disease without esophagitis: Secondary | ICD-10-CM | POA: Diagnosis not present

## 2023-11-15 DIAGNOSIS — N529 Male erectile dysfunction, unspecified: Secondary | ICD-10-CM | POA: Diagnosis not present

## 2023-11-15 DIAGNOSIS — R32 Unspecified urinary incontinence: Secondary | ICD-10-CM | POA: Diagnosis not present

## 2023-11-15 DIAGNOSIS — F0390 Unspecified dementia without behavioral disturbance: Secondary | ICD-10-CM | POA: Diagnosis not present

## 2023-11-15 DIAGNOSIS — H409 Unspecified glaucoma: Secondary | ICD-10-CM | POA: Diagnosis not present

## 2024-02-06 ENCOUNTER — Encounter (INDEPENDENT_AMBULATORY_CARE_PROVIDER_SITE_OTHER): Payer: Self-pay | Admitting: Gastroenterology

## 2024-02-22 ENCOUNTER — Encounter: Payer: Self-pay | Admitting: *Deleted

## 2024-02-22 NOTE — Progress Notes (Signed)
 HUNG RHINESMITH                                          MRN: [id number]   02/22/2024   The [facility] Quality Team Specialist reviewed this patient medical record for the purposes of chart review for care gap closure. The following were reviewed: chart review for care gap closure-controlling blood pressure.    [facility] Quality Team
